# Patient Record
Sex: Male | Born: 1980 | Race: White | Hispanic: No | Marital: Married | Smoking: Current every day smoker
Health system: Southern US, Community
[De-identification: ages and names within clinical notes are randomized; demographics above are authoritative.]

## PROBLEM LIST (undated history)

## (undated) DIAGNOSIS — F32A Depression, unspecified: Secondary | ICD-10-CM

## (undated) DIAGNOSIS — E119 Type 2 diabetes mellitus without complications: Secondary | ICD-10-CM

## (undated) DIAGNOSIS — E785 Hyperlipidemia, unspecified: Secondary | ICD-10-CM

## (undated) DIAGNOSIS — M1990 Unspecified osteoarthritis, unspecified site: Secondary | ICD-10-CM

## (undated) DIAGNOSIS — J45909 Unspecified asthma, uncomplicated: Secondary | ICD-10-CM

## (undated) DIAGNOSIS — G4730 Sleep apnea, unspecified: Secondary | ICD-10-CM

## (undated) DIAGNOSIS — F419 Anxiety disorder, unspecified: Secondary | ICD-10-CM

## (undated) DIAGNOSIS — J449 Chronic obstructive pulmonary disease, unspecified: Secondary | ICD-10-CM

## (undated) DIAGNOSIS — G709 Myoneural disorder, unspecified: Secondary | ICD-10-CM

## (undated) HISTORY — DX: Sleep apnea, unspecified: G47.30

## (undated) HISTORY — DX: Depression, unspecified: F32.A

## (undated) HISTORY — DX: Anxiety disorder, unspecified: F41.9

## (undated) HISTORY — DX: Unspecified osteoarthritis, unspecified site: M19.90

## (undated) HISTORY — DX: Hyperlipidemia, unspecified: E78.5

## (undated) HISTORY — DX: Myoneural disorder, unspecified: G70.9

## (undated) NOTE — *Deleted (*Deleted)
[facility] - Clinic Note  08/20/2020     CHIEF COMPLAINT Patient presents for No chief complaint on file.   HISTORY OF PRESENT ILLNESS: Isaac Weiss is a 46 y.o. male who presents to the clinic today for:   pt is here on the referral of Dr. Hanley Seamen for concern of NPDR OU and ARMD OS, he states he went to see him for routine eye exam due to blurry and double vision, pt states he did not get a new glasses rx, pt endorses being diabetic, he states at his last appt with Hanley Seamen he "thinks" he had an eye infection bc his eyes were leaking "green and yellow" fluid, pt states his blood sugar fluctuates from 97-300, which is an improvement from 500, last A1c was 13.6 in January 2021, pt thinks he may have seen his PCP in May  Referring physician: Lavonda Jumbo, DO [address] [place],  [place] [address]  HISTORICAL INFORMATION:   Selected notes from the MEDICAL RECORD NUMBER Referred by Dr. 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 (Ophthalmic Drugs)   No current facility-administered medications for this visit. (Ophthalmic Drugs)   Current Outpatient Medications (Other)  Medication Sig  . gabapentin (NEURONTIN) 300 MG capsule TAKE 1 CAPSULE BY MOUTH THREE TIMES DAILY  . Accu-Chek FastClix Lancets MISC Check blood sugar 4 times daily before meal and at bedtime  . albuterol (VENTOLIN HFA) 108 (90 Base) MCG/ACT inhaler Inhale 2 puffs into the lungs every 6 hours as needed for wheezing or shortness of breath.  Marland Kitchen atomoxetine (STRATTERA) 40 MG capsule Take 40 mg by mouth every morning.  . Blood Glucose Monitoring Suppl (ACCU-CHEK COMPACT CARE KIT) KIT 1 kit by Does not apply route 4 (four) times daily -  before meals and at bedtime.  . fluticasone (FLOVENT HFA) 110 MCG/ACT inhaler Inhale 1-2 puffs into the  lungs daily.  Marland Kitchen glucose blood (ACCU-CHEK GUIDE) test strip Use as instructed  . hydrOXYzine (VISTARIL) 25 MG capsule Take 25 mg by mouth 3 (three) times daily as needed.  . insulin glargine (LANTUS) 100 UNIT/ML injection Inject 0.3 mLs (30 Units total) into the skin daily.  . insulin NPH-regular Human (HUMULIN 70/30) (70-30) 100 UNIT/ML injection Inject 30 Units into the skin 3 (three) times daily before meals.  . Insulin Pen Needle 32G X 4 MM MISC Check blood sugar TID and before meals and at bed time. Administer insulin with needle.  . Insulin Syringe 27G X 1/2" 1 ML MISC Inject 10 Units into the skin 3 (three) times daily before meals.  . lamoTRIgine (LAMICTAL) 100 MG tablet Take 100 mg by mouth 2 (two) times daily.  Marland Kitchen lithium carbonate (ESKALITH) 450 MG CR tablet Take 900 mg by mouth daily.  . metFORMIN (GLUCOPHAGE-XR) 500 MG 24 hr tablet TAKE 2 TABLETS BY MOUTH 2 TIMES DAILY  . prazosin (MINIPRESS) 1 MG capsule Take 2 mg by mouth at bedtime.  . propranolol (INDERAL) 20 MG tablet Take 20 mg by mouth 3 (three) times daily.   No current facility-administered medications for this visit. (Other)      REVIEW OF SYSTEMS:    ALLERGIES No Known Allergies  PAST MEDICAL HISTORY Past Medical History:  Diagnosis Date  . Anxiety    Phreesia 07/27/2020  . Arthritis    Phreesia  07/27/2020  . Asthma   . COPD (chronic obstructive pulmonary disease) (HCC)   . Depression    Phreesia 07/27/2020  . Diabetes mellitus without complication (HCC)   . Hyperlipidemia    Phreesia 07/27/2020  . Neuromuscular disorder (HCC)    Phreesia 07/27/2020  . Sleep apnea    Phreesia 07/27/2020   No past surgical history on file.  FAMILY HISTORY No family history on file.  SOCIAL HISTORY Social History   Tobacco Use  . Smoking status: Current Every Day Smoker    Packs/day: 1.00    Types: Cigarettes  . Smokeless tobacco: Never Used  Substance Use Topics  . Alcohol use: Yes  . Drug use: Never          OPHTHALMIC EXAM:  Not recorded     IMAGING AND PROCEDURES  Imaging and Procedures for 08/20/2020           ASSESSMENT/PLAN:    ICD-10-CM   1. Mild nonproliferative diabetic retinopathy of both eyes without macular edema associated with type 2 diabetes mellitus (HCC)  Z61.0960   2. Retinal edema  H35.81   3. Hypertension, unspecified type  I10   4. Hypertensive retinopathy of both eyes  H35.033     1,2. Mild nonproliferative diabetic retinopathy w/o DME, OU - A1c 13.6 in Jan 2021 - The incidence, risk factors for progression, natural history and treatment options for diabetic retinopathy were discussed with patient.   - The need for close monitoring of blood glucose, blood pressure, and serum lipids, avoiding cigarette or any type of tobacco, and the need for long term follow up was also discussed with patient. - exam shows scattered MA OU - OCT without diabetic macular edema, OU -- tr cystic changes and IRHM OS - f/u in 3 mos -- DFE/OCT/FA (optos, transit OS)    Ophthalmic Meds Ordered this visit:  No orders of the defined types were placed in this encounter.      No follow-ups on file.  There are no Patient Instructions on file for this visit.   Explained the diagnoses, plan, and follow up with the patient and they expressed understanding.  Patient expressed understanding of the importance of proper follow up care.  This document serves as a record of services personally performed by Karie Chimera, MD, PhD. It was created on their behalf by Annalee Genta, COMT. The creation of this record is the provider's dictation and/or activities during the visit.  Electronically signed by: Annalee Genta, COMT 08/19/20 10:44 AM    Karie Chimera, M.D., Ph.D. Diseases & Surgery of the Retina and Vitreous [facility]    Abbreviations: M myopia (nearsighted); A astigmatism; H hyperopia (farsighted); P presbyopia; Mrx spectacle  prescription;  CTL contact lenses; OD right eye; OS left eye; OU both eyes  XT exotropia; ET esotropia; PEK punctate epithelial keratitis; PEE punctate epithelial erosions; DES dry eye syndrome; MGD meibomian gland dysfunction; ATs artificial tears; PFAT's preservative free artificial tears; NSC nuclear sclerotic cataract; PSC posterior subcapsular cataract; ERM epi-retinal membrane; PVD posterior vitreous detachment; RD retinal detachment; DM diabetes mellitus; DR diabetic retinopathy; NPDR non-proliferative diabetic retinopathy; PDR proliferative diabetic retinopathy; CSME clinically significant macular edema; DME diabetic macular edema; dbh dot blot hemorrhages; CWS cotton wool spot; POAG primary open angle glaucoma; C/D cup-to-disc ratio; HVF humphrey visual field; GVF goldmann visual field; OCT optical coherence tomography; IOP intraocular pressure; BRVO Branch retinal vein occlusion; CRVO central retinal vein occlusion; CRAO central retinal artery occlusion;  BRAO branch retinal artery occlusion; RT retinal tear; SB scleral buckle; PPV pars plana vitrectomy; VH Vitreous hemorrhage; PRP panretinal laser photocoagulation; IVK intravitreal kenalog; VMT vitreomacular traction; MH Macular hole;  NVD neovascularization of the disc; NVE neovascularization elsewhere; AREDS age related eye disease study; ARMD age related macular degeneration; POAG primary open angle glaucoma; EBMD epithelial/anterior basement membrane dystrophy; ACIOL anterior chamber intraocular lens; IOL intraocular lens; PCIOL posterior chamber intraocular lens; Phaco/IOL phacoemulsification with intraocular lens placement; [place] photorefractive keratectomy; LASIK laser assisted in situ keratomileusis; HTN hypertension; DM diabetes mellitus; COPD chronic obstructive pulmonary disease

---

## 2006-12-16 DIAGNOSIS — F259 Schizoaffective disorder, unspecified: Secondary | ICD-10-CM | POA: Insufficient documentation

## 2019-09-02 ENCOUNTER — Encounter (HOSPITAL_COMMUNITY): Payer: Self-pay

## 2019-09-02 ENCOUNTER — Emergency Department (HOSPITAL_COMMUNITY)
Admission: EM | Admit: 2019-09-02 | Discharge: 2019-09-03 | Disposition: A | Discharge status: Home / Self Care | Payer: Medicaid Other | Attending: Emergency Medicine | Admitting: Emergency Medicine

## 2019-09-02 ENCOUNTER — Other Ambulatory Visit: Payer: Self-pay

## 2019-09-02 DIAGNOSIS — E1165 Type 2 diabetes mellitus with hyperglycemia: Secondary | ICD-10-CM | POA: Diagnosis not present

## 2019-09-02 DIAGNOSIS — Z5321 Procedure and treatment not carried out due to patient leaving prior to being seen by health care provider: Secondary | ICD-10-CM | POA: Diagnosis not present

## 2019-09-02 DIAGNOSIS — R519 Headache, unspecified: Secondary | ICD-10-CM | POA: Diagnosis present

## 2019-09-02 HISTORY — DX: Unspecified asthma, uncomplicated: J45.909

## 2019-09-02 HISTORY — DX: Chronic obstructive pulmonary disease, unspecified: J44.9

## 2019-09-02 HISTORY — DX: Type 2 diabetes mellitus without complications: E11.9

## 2019-09-02 LAB — URINALYSIS, ROUTINE W REFLEX MICROSCOPIC
Bacteria, UA: NONE SEEN
Bilirubin Urine: NEGATIVE
Glucose, UA: >=500 mg/dL — AB
Hgb urine dipstick: NEGATIVE
Ketones, ur: NEGATIVE mg/dL
Leukocytes,Ua: NEGATIVE
Nitrite: NEGATIVE
Protein, ur: NEGATIVE mg/dL
Specific Gravity, Urine: 1.026 (ref 1.005–1.030)
pH: 7 (ref 5.0–8.0)

## 2019-09-02 LAB — CBG MONITORING, ED: Glucose-Capillary: 520 mg/dL (ref 70–99)

## 2019-09-02 LAB — CBC
HCT: 43.5 % (ref 39.0–52.0)
Hemoglobin: 15 g/dL (ref 13.0–17.0)
MCH: 33.2 pg (ref 26.0–34.0)
MCHC: 34.5 g/dL (ref 30.0–36.0)
MCV: 96.2 fL (ref 80.0–100.0)
Platelets: 263 10*3/uL (ref 150–400)
RBC: 4.52 MIL/uL (ref 4.22–5.81)
RDW: 12.1 % (ref 11.5–15.5)
WBC: 8.8 10*3/uL (ref 4.0–10.5)
nRBC: 0 % (ref 0.0–0.2)

## 2019-09-02 LAB — BASIC METABOLIC PANEL
Anion gap: 12 (ref 5–15)
BUN: 10 mg/dL (ref 6–20)
CO2: 24 mmol/L (ref 22–32)
Calcium: 9 mg/dL (ref 8.9–10.3)
Chloride: 97 mmol/L — ABNORMAL LOW (ref 98–111)
Creatinine, Ser: 0.9 mg/dL (ref 0.61–1.24)
GFR calc Af Amer: >60 mL/min (ref >60)
GFR calc non Af Amer: >60 mL/min (ref >60)
Glucose, Bld: 526 mg/dL (ref 70–99)
Potassium: 3.9 mmol/L (ref 3.5–5.1)
Sodium: 133 mmol/L — ABNORMAL LOW (ref 135–145)

## 2019-09-02 NOTE — ED Notes (Signed)
Pt stated he didn't want to wait any longer and returned his pt labels to the registration desk.

## 2019-09-02 NOTE — ED Triage Notes (Signed)
Pt arrived stating he has been shaking, having headaches and dry mouth for the last 3-4 days. Reports taking his blood sugar at 8pm reading 569. Reports being on insulin and was in jail where they stopped it, states he has not had any since January.

## 2019-09-02 NOTE — ED Notes (Signed)
CRITICAL VALUE STICKER  CRITICAL VALUE: Glu 526  RECEIVER (on-site recipient of call): Jake T RN  [place] NOTIFIED: 09/02/2019  MESSENGER (representative from lab): Blanch Media  RESPONSE: Acuity adjusted. Triage will bring patient back ASAP for provider to see.

## 2019-09-07 ENCOUNTER — Emergency Department (HOSPITAL_COMMUNITY)
Admission: EM | Admit: 2019-09-07 | Discharge: 2019-09-08 | Disposition: A | Discharge status: Home / Self Care | Payer: Medicaid Other | Attending: Emergency Medicine | Admitting: Emergency Medicine

## 2019-09-07 ENCOUNTER — Other Ambulatory Visit: Payer: Self-pay

## 2019-09-07 DIAGNOSIS — E1165 Type 2 diabetes mellitus with hyperglycemia: Secondary | ICD-10-CM | POA: Diagnosis present

## 2019-09-07 DIAGNOSIS — Z5321 Procedure and treatment not carried out due to patient leaving prior to being seen by health care provider: Secondary | ICD-10-CM | POA: Insufficient documentation

## 2019-09-07 LAB — CBC
HCT: 46.1 % (ref 39.0–52.0)
Hemoglobin: 16.1 g/dL (ref 13.0–17.0)
MCH: 33.5 pg (ref 26.0–34.0)
MCHC: 34.9 g/dL (ref 30.0–36.0)
MCV: 95.8 fL (ref 80.0–100.0)
Platelets: 279 10*3/uL (ref 150–400)
RBC: 4.81 MIL/uL (ref 4.22–5.81)
RDW: 12.1 % (ref 11.5–15.5)
WBC: 7.9 10*3/uL (ref 4.0–10.5)
nRBC: 0 % (ref 0.0–0.2)

## 2019-09-07 LAB — BASIC METABOLIC PANEL
Anion gap: 12 (ref 5–15)
BUN: 10 mg/dL (ref 6–20)
CO2: 23 mmol/L (ref 22–32)
Calcium: 9 mg/dL (ref 8.9–10.3)
Chloride: 95 mmol/L — ABNORMAL LOW (ref 98–111)
Creatinine, Ser: 1.16 mg/dL (ref 0.61–1.24)
GFR calc Af Amer: >60 mL/min (ref >60)
GFR calc non Af Amer: >60 mL/min (ref >60)
Glucose, Bld: 514 mg/dL (ref 70–99)
Potassium: 4.2 mmol/L (ref 3.5–5.1)
Sodium: 130 mmol/L — ABNORMAL LOW (ref 135–145)

## 2019-09-07 LAB — CBG MONITORING, ED: Glucose-Capillary: 574 mg/dL (ref 70–99)

## 2019-09-07 NOTE — ED Triage Notes (Signed)
Patient states that his blood sugar has been high for the last couple of days. States he is not on insulin and his dr. Marland Kitchenwon't put me on it". NAD noted.

## 2019-09-08 NOTE — ED Notes (Signed)
Pt called 2nd time no answer 

## 2019-09-08 NOTE — ED Notes (Signed)
Pt called for room no answer

## 2019-09-12 ENCOUNTER — Ambulatory Visit (INDEPENDENT_AMBULATORY_CARE_PROVIDER_SITE_OTHER): Payer: Medicaid Other | Admitting: Family Medicine

## 2019-09-12 ENCOUNTER — Other Ambulatory Visit: Payer: Self-pay

## 2019-09-12 ENCOUNTER — Encounter: Payer: Self-pay | Admitting: Family Medicine

## 2019-09-12 DIAGNOSIS — Z23 Encounter for immunization: Secondary | ICD-10-CM

## 2019-09-12 DIAGNOSIS — J45909 Unspecified asthma, uncomplicated: Secondary | ICD-10-CM | POA: Insufficient documentation

## 2019-09-12 DIAGNOSIS — G4730 Sleep apnea, unspecified: Secondary | ICD-10-CM | POA: Insufficient documentation

## 2019-09-12 DIAGNOSIS — J449 Chronic obstructive pulmonary disease, unspecified: Secondary | ICD-10-CM | POA: Insufficient documentation

## 2019-09-12 DIAGNOSIS — E1169 Type 2 diabetes mellitus with other specified complication: Secondary | ICD-10-CM | POA: Insufficient documentation

## 2019-09-12 DIAGNOSIS — E119 Type 2 diabetes mellitus without complications: Secondary | ICD-10-CM | POA: Insufficient documentation

## 2019-09-12 LAB — POCT GLYCOSYLATED HEMOGLOBIN (HGB A1C): HbA1c, POC (controlled diabetic range): 11.3 % — AB (ref 0.0–7.0)

## 2019-09-12 MED ORDER — METFORMIN HCL ER 500 MG PO TB24: 1000.0000 mg | ORAL_TABLET | Freq: Two times a day (BID) | ORAL | 3 refills | Status: DC

## 2019-09-12 MED ORDER — METFORMIN HCL ER 500 MG PO TB24: 1000.0000 mg | ORAL_TABLET | Freq: Every day | ORAL | 3 refills | Status: DC

## 2019-09-12 MED ORDER — INSULIN GLARGINE 100 UNIT/ML SOLOSTAR PEN: 20.0000 [IU] | PEN_INJECTOR | SUBCUTANEOUS | 11 refills | Status: DC

## 2019-09-12 MED ORDER — ACCU-CHEK AVIVA PLUS VI STRP: ORAL_STRIP | 12 refills | Status: DC

## 2019-09-12 MED ORDER — ACCU-CHEK COMPACT PLUS CARE KIT: 1.0000 | PACK | Freq: Three times a day (TID) | 0 refills | Status: DC

## 2019-09-12 MED ORDER — ACCU-CHEK MULTICLIX LANCETS MISC: 12 refills | Status: DC

## 2019-09-12 NOTE — Progress Notes (Signed)
   Subjective:    Patient ID: Isaac Weiss, male    DOB: September 22, 1981, 38 y.o.   MRN: [id number]   CC: Establish care  HPI:  Uncontrolled T2DM Isaac Weiss is presenting today to establish care.  He was recently released from prison and while in prison they stopped his insulin in December of last year or January of this year. Was taking metformin and insulin and felt great losing weight. He was taking Novolin 70/30 15u in am and 20u at night before dinner.  He says his A1c has steadily been increasing from 5.4 to 7.5 since being off of insulin. Blood sugars in the 500s and was told to check twice a day but checks 4-5x/ day now. This morning at 3 am CBG was 202, later this morning 258 was snacking on candy corn. Tries not to eat because his sugars are so high but he did not have breakfast.  Recently was seen in the ED due to shaking and dizziness and disoriented.  Occasionally cant remember words, has headaches, dry mouth, and thick saliva. Weakness sometimes, with polydipsia, polyuria, but without polyphagia. Mouth sores cracked red painful about 4 months.   Smoking status reviewed.  Current smoker cessation discussed in reference to uncontrolled diabetes is down to 1 pack a day.  Review of Systems Per HPI, also denies recent illness, fever, changes in vision, chest pain, shortness of breath, abdominal pain, N/V/D   Patient Active Problem List   Diagnosis Date Noted  . Sleep apnea 09/12/2019  . Diabetes mellitus without complication ([place])   . Asthma   . COPD (chronic obstructive pulmonary disease) (HCC)      Objective:  BP 128/78   Pulse 78   Ht 5\' 8"  (1.727 m)   Wt 278 lb (126.1 kg)   SpO2 97%   BMI 42.27 kg/m  Vitals and nursing note reviewed  General: Appears well, no acute distress. Age appropriate. Cardiac: RRR, normal heart sounds, no murmurs Respiratory: CTAB, normal effort Abdomen: soft, nontender, nondistended Extremities: No edema or cyanosis. Skin: Warm and dry, no  rashes noted. New tattoo with erythema. Neuro: alert and oriented, no focal deficits Psych: normal affect Diabetic Foot Exam - Simple   Simple Foot Form Diabetic Foot exam was performed with the following findings: Yes 09/12/2019 11:21 AM  Visual Inspection Sensation Testing Pulse Check Comments     Assessment & Plan:    Diabetes mellitus without complication (HCC) Uncontrolled.  A1c today is 11.3.  We will start Lantus 20 units in the morning daily.  Continue Metformin increasing dose to 1000 mg twice daily.  Check blood sugars 3 times daily before meals and at bedtime.  Keep a log and bring to next office visit.  Follow-up in 1 month or sooner if needed for repeat A1c and insulin adjustment.  Return precautions and reasons to present to the ED discussed.    Gerlene Fee, [facility] PGY-1

## 2019-09-12 NOTE — Patient Instructions (Signed)
It was very nice to meet you today. Please enjoy the rest of your week. Today you were seen to establish care.  Your A1c was 11.3 we changed medications stop pioglitazone, stop glipizide, stop current Metformin regimen. I have prescribed Lantus 20 units daily, Metformin 1000 units twice daily. Follow up in 1 month for a repeat A1c and to address other concerns or sooner if needed.   Please call the clinic at [phone] if your symptoms worsen or you have any concerns. It was our pleasure to serve you.

## 2019-09-14 ENCOUNTER — Telehealth: Payer: Self-pay | Admitting: *Deleted

## 2019-09-14 NOTE — Telephone Encounter (Signed)
Rx request for pen needles. Isaac Weiss, CMA

## 2019-09-17 ENCOUNTER — Encounter: Payer: Self-pay | Admitting: Family Medicine

## 2019-09-17 MED ORDER — INSULIN PEN NEEDLE 32G X 4 MM MISC: 0 refills | Status: DC

## 2019-09-17 NOTE — Telephone Encounter (Signed)
Pt informed.  Herron Fero, CMA  

## 2019-09-18 ENCOUNTER — Encounter: Payer: Self-pay | Admitting: Family Medicine

## 2019-09-18 ENCOUNTER — Telehealth: Payer: Self-pay | Admitting: *Deleted

## 2019-09-18 NOTE — Telephone Encounter (Signed)
Pt informed. Deseree Blount, CMA  

## 2019-09-18 NOTE — Assessment & Plan Note (Signed)
Uncontrolled.  A1c today is 11.3.  We will start Lantus 20 units in the morning daily.  Continue Metformin increasing dose to 1000 mg twice daily.  Check blood sugars 3 times daily before meals and at bedtime.  Keep a log and bring to next office visit.  Follow-up in 1 month or sooner if needed for repeat A1c and insulin adjustment.  Return precautions and reasons to present to the ED discussed.

## 2019-11-10 ENCOUNTER — Other Ambulatory Visit: Payer: Self-pay | Admitting: Family Medicine

## 2019-11-10 DIAGNOSIS — E119 Type 2 diabetes mellitus without complications: Secondary | ICD-10-CM

## 2019-11-20 ENCOUNTER — Ambulatory Visit (INDEPENDENT_AMBULATORY_CARE_PROVIDER_SITE_OTHER): Payer: Medicaid Other | Admitting: Family Medicine

## 2019-11-20 ENCOUNTER — Other Ambulatory Visit: Payer: Self-pay

## 2019-11-20 VITALS — BP 110/72 | HR 83 | Wt 274.6 lb

## 2019-11-20 DIAGNOSIS — Z0000 Encounter for general adult medical examination without abnormal findings: Secondary | ICD-10-CM | POA: Diagnosis not present

## 2019-11-20 DIAGNOSIS — M25512 Pain in left shoulder: Secondary | ICD-10-CM

## 2019-11-20 DIAGNOSIS — E119 Type 2 diabetes mellitus without complications: Secondary | ICD-10-CM

## 2019-11-20 DIAGNOSIS — J449 Chronic obstructive pulmonary disease, unspecified: Secondary | ICD-10-CM

## 2019-11-20 DIAGNOSIS — M5410 Radiculopathy, site unspecified: Secondary | ICD-10-CM

## 2019-11-20 DIAGNOSIS — G8929 Other chronic pain: Secondary | ICD-10-CM

## 2019-11-20 LAB — POCT GLYCOSYLATED HEMOGLOBIN (HGB A1C): HbA1c, POC (controlled diabetic range): 13.6 % — AB (ref 0.0–7.0)

## 2019-11-20 MED ORDER — FLOVENT HFA 110 MCG/ACT IN AERO: 1.0000 | INHALATION_SPRAY | Freq: Every day | RESPIRATORY_TRACT | 12 refills | Status: DC

## 2019-11-20 MED ORDER — ACCU-CHEK FASTCLIX LANCETS MISC: 12 refills | Status: DC

## 2019-11-20 MED ORDER — ALBUTEROL SULFATE HFA 108 (90 BASE) MCG/ACT IN AERS: 2.0000 | INHALATION_SPRAY | Freq: Four times a day (QID) | RESPIRATORY_TRACT | 0 refills | Status: DC | PRN

## 2019-11-20 MED ORDER — INSULIN PEN NEEDLE 32G X 4 MM MISC: 3 refills | Status: DC

## 2019-11-20 MED ORDER — GABAPENTIN 300 MG PO CAPS: 300.0000 mg | ORAL_CAPSULE | Freq: Three times a day (TID) | ORAL | 3 refills | Status: DC

## 2019-11-20 MED ORDER — ACCU-CHEK GUIDE VI STRP: ORAL_STRIP | 12 refills | Status: DC

## 2019-11-20 MED ORDER — NOVOLIN 70/30 (70-30) 100 UNIT/ML ~~LOC~~ SUSP: 10.0000 [IU] | Freq: Three times a day (TID) | SUBCUTANEOUS | 11 refills | Status: DC

## 2019-11-20 NOTE — Patient Instructions (Addendum)
It was very nice to see you today. Please enjoy the rest of your week. Today you were seen for a diabetes follow up. We changed you insulin regimen to Novolin 70/30 you will inject 10U three times daily with meals. I have prescribed gabapentin for your shoulder pain. You will receive your results via phone or my chart. Follow up in 3 months for diabetes check up or sooner if needed. You will be contacted by a chronic care management team member for help with increasing insulin.  Please call the clinic at [phone] if your symptoms worsen or you have any concerns. It was our pleasure to serve you.  Diabetes Mellitus and Nutrition, Adult When you have diabetes (diabetes mellitus), it is very important to have healthy eating habits because your blood sugar (glucose) levels are greatly affected by what you eat and drink. Eating healthy foods in the appropriate amounts, at about the same times every day, can help you:  Control your blood glucose.  Lower your risk of heart disease.  Improve your blood pressure.  Reach or maintain a healthy weight. Every person with diabetes is different, and each person has different needs for a meal plan. Your health care provider may recommend that you work with a diet and nutrition specialist (dietitian) to make a meal plan that is best for you. Your meal plan may vary depending on factors such as:  The calories you need.  The medicines you take.  Your weight.  Your blood glucose, blood pressure, and cholesterol levels.  Your activity level.  Other health conditions you have, such as heart or kidney disease. How do carbohydrates affect me? Carbohydrates, also called carbs, affect your blood glucose level more than any other type of food. Eating carbs naturally raises the amount of glucose in your blood. Carb counting is a method for keeping track of how many carbs you eat. Counting carbs is important to keep your blood glucose at a healthy level,  especially if you use insulin or take certain oral diabetes medicines. It is important to know how many carbs you can safely have in each meal. This is different for every person. Your dietitian can help you calculate how many carbs you should have at each meal and for each snack. Foods that contain carbs include:  Bread, cereal, rice, pasta, and crackers.  Potatoes and corn.  Peas, beans, and lentils.  Milk and yogurt.  Fruit and juice.  Desserts, such as cakes, cookies, ice cream, and candy. How does alcohol affect me? Alcohol can cause a sudden decrease in blood glucose (hypoglycemia), especially if you use insulin or take certain oral diabetes medicines. Hypoglycemia can be a life-threatening condition. Symptoms of hypoglycemia (sleepiness, dizziness, and confusion) are similar to symptoms of having too much alcohol. If your health care provider says that alcohol is safe for you, follow these guidelines:  Limit alcohol intake to no more than 1 drink per day for nonpregnant women and 2 drinks per day for men. One drink equals 12 oz of beer, 5 oz of wine, or 1 oz of hard liquor.  Do not drink on an empty stomach.  Keep yourself hydrated with water, diet soda, or unsweetened iced tea.  Keep in mind that regular soda, juice, and other mixers may contain a lot of sugar and must be counted as carbs. What are tips for following this plan?  Reading food labels  Start by checking the serving size on the "Nutrition Facts" label of packaged foods and  drinks. The amount of calories, carbs, fats, and other nutrients listed on the label is based on one serving of the item. Many items contain more than one serving per package.  Check the total grams (g) of carbs in one serving. You can calculate the number of servings of carbs in one serving by dividing the total carbs by 15. For example, if a food has 30 g of total carbs, it would be equal to 2 servings of carbs.  Check the number of grams  (g) of saturated and trans fats in one serving. Choose foods that have low or no amount of these fats.  Check the number of milligrams (mg) of salt (sodium) in one serving. Most people should limit total sodium intake to less than 2,300 mg per day.  Always check the nutrition information of foods labeled as "low-fat" or "nonfat". These foods may be higher in added sugar or refined carbs and should be avoided.  Talk to your dietitian to identify your daily goals for nutrients listed on the label. Shopping  Avoid buying canned, premade, or processed foods. These foods tend to be high in fat, sodium, and added sugar.  Shop around the outside edge of the grocery store. This includes fresh fruits and vegetables, bulk grains, fresh meats, and fresh dairy. Cooking  Use low-heat cooking methods, such as baking, instead of high-heat cooking methods like deep frying.  Cook using healthy oils, such as olive, canola, or sunflower oil.  Avoid cooking with butter, cream, or high-fat meats. Meal planning  Eat meals and snacks regularly, preferably at the same times every day. Avoid going long periods of time without eating.  Eat foods high in fiber, such as fresh fruits, vegetables, beans, and whole grains. Talk to your dietitian about how many servings of carbs you can eat at each meal.  Eat 4-6 ounces (oz) of lean protein each day, such as lean meat, chicken, fish, eggs, or tofu. One oz of lean protein is equal to: ? 1 oz of meat, chicken, or fish. ? 1 egg. ?  cup of tofu.  Eat some foods each day that contain healthy fats, such as avocado, nuts, seeds, and fish. Lifestyle  Check your blood glucose regularly.  Exercise regularly as told by your health care provider. This may include: ? 150 minutes of moderate-intensity or vigorous-intensity exercise each week. This could be brisk walking, biking, or water aerobics. ? Stretching and doing strength exercises, such as yoga or weightlifting, at  least 2 times a week.  Take medicines as told by your health care provider.  Do not use any products that contain nicotine or tobacco, such as cigarettes and e-cigarettes. If you need help quitting, ask your health care provider.  Work with a Social worker or diabetes educator to identify strategies to manage stress and any emotional and social challenges. Questions to ask a health care provider  Do I need to meet with a diabetes educator?  Do I need to meet with a dietitian?  What number can I call if I have questions?  When are the best times to check my blood glucose? Where to find more information:  American Diabetes Association: diabetes.org  Academy of Nutrition and Dietetics: www.eatright.[employer] of Diabetes and Digestive and Kidney Diseases (NIH): [employer] Summary  A healthy meal plan will help you control your blood glucose and maintain a healthy lifestyle.  Working with a diet and nutrition specialist (dietitian) can help you make a meal plan that is  best for you.  Keep in mind that carbohydrates (carbs) and alcohol have immediate effects on your blood glucose levels. It is important to count carbs and to use alcohol carefully. This information is not intended to replace advice given to you by your health care provider. Make sure you discuss any questions you have with your health care provider. Document Revised: 09/30/2017 Document Reviewed: 11/22/2016 Elsevier Patient Education  2020 [employer].

## 2019-11-20 NOTE — Assessment & Plan Note (Addendum)
Not well controlled. Today A1c is 13.6. Microalbumin/creatinine ratio is 9. Lipid panel tot chol 165, LDL 76, HDL 28, triglycerides 379. At our initial visit we started at 20 U, patient increased himself to 40U and ran out of insulin and did not notify the office. Today we will stop lantus and start humulin 10 units before meals. (Novolin was not covered by patients insurance). -Continue Metformin -Discontinue Lantus -Start Humulin 10U TID before meals -Referral to CCM, will appreciate recommendations in the interim  -Return in 3 months will log for repeat A1c, discuss lipid panel further to improve HDL and decrease triglycerides

## 2019-11-20 NOTE — Progress Notes (Signed)
  Patient Name: Isaac Weiss Date of Birth: [birth year] Date of Visit: 11/20/19 PCP: Lavonda Jumbo, DO  Chief Complaint: Diabetes check up  Subjective: Isaac Weiss is a pleasant 39 y.o. with medical history significant for insulin dependent type II diabetes and asthma/COPD presenting today for a diabetes check up.   T2DM Since our last visit he has been out of insulin because he increased his Lantus from 20 to 40 units. He says he has a history of being on 100 units of lantus before so he wanted to increase it on his own because he knew 20 units was not enough. He continues to have fatigue, polyphagia, polyuria, blurry vision, as well as hand and feet paresthesias. He checks his blood sugar 4 times daily before meal and before bed. His average blood sugar is self reported as 400 with the lowest being 295. In the past has done better with Novolin/Humulin.   Lt. Shoulder pain He is experiencing diffuse left shoulder pain although more pronounced anteriorly. It is sharp in nature and is chronic but feels like it is worsening due to newly interrupting his sleep. He has used heating pads and tiger balm with minimal relief. He has some tingling some times in the first 3 fingers and sometimes in the last 2 fingers. He does not endorse numbness along the shoulder or forearm muscles.   Asthma/COPD Occasionally has shortness of breath but not outside of his normal. He does not have any medication and needs a refill. He continues to smoke and understands this will improve his overall health as well as aiding with less shortness of breath. He does not have issue with breathing currently and does not have chest pain with these episodes. His last episode of increase work of breathing was during a sexual encounter with his wife.   ROS: Per HPI.   I have reviewed the patient's medical, surgical, family, and social history as appropriate.   Objective: Vitals:   11/20/19 1521  BP: 110/72  Pulse:  83  SpO2: 98%     Assessment & Plan:  Diabetes mellitus without complication (HCC) Not well controlled. Today A1c is 13.6. Microalbumin/creatinine ratio is 9. Lipid panel tot chol 165, LDL 76, HDL 28, triglycerides 379. At our initial visit we started at 20 U, patient increased himself to 40U and ran out of insulin and did not notify the office. Today we will stop lantus and start humulin 10 units before meals. (Novolin was not covered by patients insurance). -Continue Metformin -Discontinue Lantus -Start Humulin 10U TID before meals -Referral to CCM, will appreciate recommendations in the interim  -Return in 3 months will log for repeat A1c, discuss lipid panel further to improve HDL and decrease triglycerides  COPD (chronic obstructive pulmonary disease) (HCC) -Start flovent daily and albuterol PRN -Consider smoking cessation, continue to counsel at subsequent visits -Go to ED if symptoms worsen or fail to improve.   Shoulder pain, left Chronic. 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 Difficult to distinguish between paresthesia due to his diabetes verses an impinged extremity nerve. Will trial gabapentin for symptomatic relief. -Continue home heating pad and muscle rub -Start gabapentin 300mg  TID PRN -Return if symptoms worsen or fail to improve.    , DO [facility] PGY-1  *Patient discussed with [facility]. [facility]

## 2019-11-21 ENCOUNTER — Other Ambulatory Visit: Payer: Self-pay | Admitting: Family Medicine

## 2019-11-21 ENCOUNTER — Encounter: Payer: Self-pay | Admitting: Family Medicine

## 2019-11-21 DIAGNOSIS — E119 Type 2 diabetes mellitus without complications: Secondary | ICD-10-CM

## 2019-11-21 LAB — LIPID PANEL
Chol/HDL Ratio: 5.9 ratio — ABNORMAL HIGH (ref 0.0–5.0)
Cholesterol, Total: 165 mg/dL (ref 100–199)
HDL: 28 mg/dL — ABNORMAL LOW (ref >39)
LDL Chol Calc (NIH): 76 mg/dL (ref 0–99)
Triglycerides: 379 mg/dL — ABNORMAL HIGH (ref 0–149)
VLDL Cholesterol Cal: 61 mg/dL — ABNORMAL HIGH (ref 5–40)

## 2019-11-21 MED ORDER — "INSULIN SYRINGE 27G X 1/2"" 1 ML MISC": 10.0000 [IU] | Freq: Three times a day (TID) | 3 refills | Status: AC

## 2019-11-21 MED ORDER — HUMULIN 70/30 (70-30) 100 UNIT/ML ~~LOC~~ SUSP: 10.0000 [IU] | Freq: Three times a day (TID) | SUBCUTANEOUS | 11 refills | Status: DC

## 2019-11-22 ENCOUNTER — Ambulatory Visit: Payer: Self-pay | Admitting: Pharmacist

## 2019-11-22 ENCOUNTER — Encounter: Payer: Self-pay | Admitting: Family Medicine

## 2019-11-22 DIAGNOSIS — E119 Type 2 diabetes mellitus without complications: Secondary | ICD-10-CM

## 2019-11-22 LAB — MICROALBUMIN / CREATININE URINE RATIO
Creatinine, Urine: 46.6 mg/dL
Microalb/Creat Ratio: 9 mg/g creat (ref 0–29)
Microalbumin, Urine: 4.3 ug/mL

## 2019-11-26 ENCOUNTER — Encounter: Payer: Self-pay | Admitting: Family Medicine

## 2019-11-26 DIAGNOSIS — M25512 Pain in left shoulder: Secondary | ICD-10-CM | POA: Insufficient documentation

## 2019-11-26 DIAGNOSIS — M25511 Pain in right shoulder: Secondary | ICD-10-CM | POA: Insufficient documentation

## 2019-11-26 DIAGNOSIS — F418 Other specified anxiety disorders: Secondary | ICD-10-CM | POA: Insufficient documentation

## 2019-11-26 NOTE — Assessment & Plan Note (Addendum)
-  Start flovent daily and albuterol PRN -Consider smoking cessation, continue to counsel at subsequent visits -Go to ED if symptoms worsen or fail to improve.

## 2019-11-26 NOTE — Assessment & Plan Note (Signed)
Chronic. 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 Difficult to distinguish between paresthesia due to his diabetes verses an impinged extremity nerve. Will trial gabapentin for symptomatic relief. -Continue home heating pad and muscle rub -Start gabapentin 300mg  TID PRN -Return if symptoms worsen or fail to improve.

## 2019-11-27 ENCOUNTER — Ambulatory Visit: Payer: Self-pay | Admitting: Pharmacist

## 2019-11-27 DIAGNOSIS — E119 Type 2 diabetes mellitus without complications: Secondary | ICD-10-CM

## 2019-11-27 NOTE — Progress Notes (Signed)
Chronic Care Management   Visit Note  11/27/2019 Name: Isaac Weiss MRN: [id number] DOB: 02-27-1981  Referred by: Gerlene Fee, DO Reason for referral : Chronic Care Management   Isaac Weiss is a 39 y.o. year old male who is a primary care patient of [place], Naaman Plummer, DO. The CCM team was consulted for assistance with chronic disease management and care coordination needs related to DMII  Review of patient status, including review of consultants reports, relevant laboratory and other test results, and collaboration with appropriate care team members and the patient's provider was performed as part of comprehensive patient evaluation and provision of chronic care management services.     Medications: Outpatient Encounter Medications as of 11/27/2019  Medication Sig  . gabapentin (NEURONTIN) 300 MG capsule Take 1 capsule (300 mg total) by mouth 3 (three) times daily.  . Accu-Chek FastClix Lancets MISC Check blood sugar 4 times daily before meal and at bedtime  . albuterol (VENTOLIN HFA) 108 (90 Base) MCG/ACT inhaler Inhale 2 puffs into the lungs every 6 (six) hours as needed for wheezing or shortness of breath.  . ARIPiprazole (ABILIFY) 10 MG tablet Take 10 mg by mouth at bedtime.  Marland Kitchen atomoxetine (STRATTERA) 40 MG capsule Take 40 mg by mouth every morning.  . Blood Glucose Monitoring Suppl (ACCU-CHEK COMPACT CARE KIT) KIT 1 kit by Does not apply route 4 (four) times daily -  before meals and at bedtime.  . fluticasone (FLOVENT HFA) 110 MCG/ACT inhaler Inhale 1-2 puffs into the lungs daily.  Marland Kitchen glucose blood (ACCU-CHEK GUIDE) test strip Use as instructed  . insulin NPH-regular Human (HUMULIN 70/30) (70-30) 100 UNIT/ML injection Inject 10 Units into the skin 3 (three) times daily before meals.  . Insulin Pen Needle 32G X 4 MM MISC Check blood sugar TID and before meals and at bed time. Administer insulin with needle.  . Insulin Syringe 27G X 1/2" 1 ML MISC Inject 10 Units into  the skin 3 (three) times daily before meals.  . metFORMIN (GLUCOPHAGE-XR) 500 MG 24 hr tablet Take 2 tablets (1,000 mg total) by mouth 2 (two) times daily.  . [DISCONTINUED] STRATTERA 100 MG capsule PO 1x daily   No facility-administered encounter medications on file as of 11/27/2019.     Objective:   Goals Addressed            This Visit's Progress     Patient Stated   . I would like to manage my diabetes (pt-stated)       Current Barriers:  . Diabetes: T2DM;  most recent A1c 13.6% . Current antihyperglycemic regimen: metformin, Humulin 70/30 15 units TID meals o Patient has started insulin: - 1/24 440 346 401 344  - 1/25 285 411 389 455 - 1/26 456, 11am 556 - Increase insulin to 15 units TID meals, will likely have to increase to 20 units TID . Denies hypoglycemic symptoms . Reports hyperglycemic symptoms, including:polyuria, polydipsia, polyphagia, nocturia, blurred vision, neuropathy . Current meal patterns: o Breakfast: biscuits,eggs, bacon o Lunch/supper:eats a lot of chicken and pork, potatoes, fries o Snacks: n/a o Drinks: drinks 2-3 32 oz soft drinks/day . Current exercise: n/a (encouraged) . Current blood glucose readings: 400-600  . Cardiovascular risk reduction: o Current hypertensive regimen: n/a o Current hyperlipidemia regimen:  o Current antiplatelet regimen: n/a  Pharmacist Clinical Goal(s):  Marland Kitchen Over the next 90 days, patient will work with PharmD and primary care provider to address needs related to diabetes management  Interventions: . Comprehensive medication review  performed, medication list updated in electronic medical record . 11/22/19 Call placed to [facility] to confirm medications were ready.  Patient has not been compliant with fills.  Ordered metformin refill.  Ordered syringe refills . Reviewed & discussed the following diabetes-related information with patient: o Continue checking blood sugars as directed o Follow ADA recommended  "diabetes-friendly" diet  (reviewed healthy snack/food options)  Patient Self Care Activities:  . Patient will check blood glucose 4x daily , document, and provide at future appointments . Patient will focus on medication adherence  . Patient will take medications as prescribed . Patient will contact provider with any episodes of hypoglycemia . Patient will report any questions or concerns to provider   Please see past updates related to this goal by clicking on the "Past Updates" button in the selected goal         Plan:   The care management team will reach out to the patient again over the next 5 days.   Provider Signature Regina Eck, PharmD, BCPS Clinical Pharmacist, [place]: [phone]

## 2019-11-29 ENCOUNTER — Ambulatory Visit: Payer: Self-pay | Admitting: Pharmacist

## 2019-11-29 DIAGNOSIS — E119 Type 2 diabetes mellitus without complications: Secondary | ICD-10-CM

## 2019-11-29 NOTE — Progress Notes (Signed)
  Chronic Care Management   Initial Visit Note  11/22/2019 Name: Isaac Weiss MRN: [id number] DOB: May 15, 1981  Referred by: Isaac Fee, DO Reason for referral : Chronic Care Management (Diabetes)   Isaac Weiss is a 39 y.o. year old male who is a primary care patient of [place], Isaac Plummer, DO. The CCM team was consulted for assistance with chronic disease management and care coordination needs related to DMII  Review of patient status, including review of consultants reports, relevant laboratory and other test results, and collaboration with appropriate care team members and the patient's provider was performed as part of comprehensive patient evaluation and provision of chronic care management services.     Medications: Outpatient Encounter Medications as of 11/22/2019  Medication Sig  . Accu-Chek FastClix Lancets MISC Check blood sugar 4 times daily before meal and at bedtime  . albuterol (VENTOLIN HFA) 108 (90 Base) MCG/ACT inhaler Inhale 2 puffs into the lungs every 6 (six) hours as needed for wheezing or shortness of breath.  . Blood Glucose Monitoring Suppl (ACCU-CHEK COMPACT CARE KIT) KIT 1 kit by Does not apply route 4 (four) times daily -  before meals and at bedtime.  . fluticasone (FLOVENT HFA) 110 MCG/ACT inhaler Inhale 1-2 puffs into the lungs daily.  Marland Kitchen gabapentin (NEURONTIN) 300 MG capsule Take 1 capsule (300 mg total) by mouth 3 (three) times daily.  Marland Kitchen glucose blood (ACCU-CHEK GUIDE) test strip Use as instructed  . insulin NPH-regular Human (HUMULIN 70/30) (70-30) 100 UNIT/ML injection Inject 10 Units into the skin 3 (three) times daily before meals.  . Insulin Pen Needle 32G X 4 MM MISC Check blood sugar TID and before meals and at bed time. Administer insulin with needle.  . Insulin Syringe 27G X 1/2" 1 ML MISC Inject 10 Units into the skin 3 (three) times daily before meals.  . metFORMIN (GLUCOPHAGE-XR) 500 MG 24 hr tablet Take 2 tablets (1,000 mg total) by  mouth 2 (two) times daily.   No facility-administered encounter medications on file as of 11/22/2019.     Objective:   Goals Addressed            This Visit's Progress     Patient Stated   . I would like to manage my diabetes (pt-stated)          Plan:   The care management team will reach out to the patient again over the next 7 days.   Provider Signature  Regina Eck, PharmD, BCPS Clinical Pharmacist, [place]: [phone]

## 2019-11-30 NOTE — Progress Notes (Signed)
Chronic Care Management    Visit Note  11/29/2019 Name: Isaac Weiss MRN: [id number] DOB: 12-07-1980  Referred by: Gerlene Fee, DO Reason for referral : Chronic Care Management (Diabetes)   Isaac Weiss is a 39 y.o. year old male who is a primary care patient of [place], Naaman Plummer, DO. The CCM team was consulted for assistance with chronic disease management and care coordination needs related to DMII  Review of patient status, including review of consultants reports, relevant laboratory and other test results, and collaboration with appropriate care team members and the patient's provider was performed as part of comprehensive patient evaluation and provision of chronic care management services.    Medications: Outpatient Encounter Medications as of 11/29/2019  Medication Sig  . Accu-Chek FastClix Lancets MISC Check blood sugar 4 times daily before meal and at bedtime  . albuterol (VENTOLIN HFA) 108 (90 Base) MCG/ACT inhaler Inhale 2 puffs into the lungs every 6 (six) hours as needed for wheezing or shortness of breath.  . ARIPiprazole (ABILIFY) 10 MG tablet Take 10 mg by mouth at bedtime.  Marland Kitchen atomoxetine (STRATTERA) 40 MG capsule Take 40 mg by mouth every morning.  . Blood Glucose Monitoring Suppl (ACCU-CHEK COMPACT CARE KIT) KIT 1 kit by Does not apply route 4 (four) times daily -  before meals and at bedtime.  . fluticasone (FLOVENT HFA) 110 MCG/ACT inhaler Inhale 1-2 puffs into the lungs daily.  Marland Kitchen gabapentin (NEURONTIN) 300 MG capsule Take 1 capsule (300 mg total) by mouth 3 (three) times daily.  Marland Kitchen glucose blood (ACCU-CHEK GUIDE) test strip Use as instructed  . insulin NPH-regular Human (HUMULIN 70/30) (70-30) 100 UNIT/ML injection Inject 10 Units into the skin 3 (three) times daily before meals.  . Insulin Pen Needle 32G X 4 MM MISC Check blood sugar TID and before meals and at bed time. Administer insulin with needle.  . Insulin Syringe 27G X 1/2" 1 ML MISC Inject 10  Units into the skin 3 (three) times daily before meals.  . metFORMIN (GLUCOPHAGE-XR) 500 MG 24 hr tablet Take 2 tablets (1,000 mg total) by mouth 2 (two) times daily.   No facility-administered encounter medications on file as of 11/29/2019.     Objective:   Goals Addressed            This Visit's Progress     Patient Stated   . I would like to manage my diabetes (pt-stated)       Current Barriers:  . Diabetes: T2DM;  most recent A1c 13.6% . Current antihyperglycemic regimen: metformin, Humulin 70/30 15 units TID meals o Patient has started insulin: - 1/24 440 346 401 344  - 1/25 285 411 389 455 - 1/26 456, 11am 556 - Patient still running in 400s - Increase insulin to 20 units TID meals . Denies hypoglycemic symptoms . Reports hyperglycemic symptoms, including:polyuria, polydipsia, polyphagia, nocturia, blurred vision, neuropathy . Current meal patterns: o Breakfast: biscuits,eggs, bacon o Lunch/supper:eats a lot of chicken and pork, potatoes, fries o Snacks: n/a o Drinks: drinks 2-3 32 oz soft drinks/day . Current exercise: n/a (encouraged) . Current blood glucose readings: 400-600  . Cardiovascular risk reduction: o Current hypertensive regimen: n/a o Current hyperlipidemia regimen:  o Current antiplatelet regimen: n/a  Pharmacist Clinical Goal(s):  Marland Kitchen Over the next 90 days, patient will work with PharmD and primary care provider to address needs related to diabetes management  Interventions: . Comprehensive medication review performed, medication list updated in electronic medical record . 11/22/19 Call placed  to [facility] to confirm medications were ready.  Patient has not been compliant with fills.  Ordered metformin refill.  Ordered syringe refills . Reviewed & discussed the following diabetes-related information with patient: o Continue checking blood sugars as directed o Follow ADA recommended "diabetes-friendly" diet  (reviewed healthy snack/food  options)  Patient Self Care Activities:  . Patient will check blood glucose 4x daily , document, and provide at future appointments . Patient will focus on medication adherence  . Patient will take medications as prescribed . Patient will contact provider with any episodes of hypoglycemia . Patient will report any questions or concerns to provider   Please see past updates related to this goal by clicking on the "Past Updates" button in the selected goal         Plan:   The care management team will reach out to the patient again over the next 7 days.   Provider Signature Regina Eck, PharmD, BCPS Clinical Pharmacist, [place]: [phone]

## 2019-12-06 ENCOUNTER — Ambulatory Visit: Payer: Self-pay | Admitting: Pharmacist

## 2019-12-06 ENCOUNTER — Other Ambulatory Visit: Payer: Self-pay | Admitting: Pharmacist

## 2019-12-06 DIAGNOSIS — E119 Type 2 diabetes mellitus without complications: Secondary | ICD-10-CM

## 2019-12-06 MED ORDER — HUMULIN 70/30 (70-30) 100 UNIT/ML ~~LOC~~ SUSP: 25.0000 [IU] | Freq: Three times a day (TID) | SUBCUTANEOUS | 11 refills | Status: DC

## 2019-12-06 NOTE — Telephone Encounter (Signed)
Dr. Salvadore Dom,  Patient called blood sugars are running 200-300 (this is an improvement)--increasing 70/30 insulin to 25 units TID. He continues on metformin, but not always compliant.  I'd like to add a GLP1 to regimen.  He also states he is not being compliant with diet/exercise.    Thank you! Germain Osgood, PharmD, BCPS Clinical Pharmacist, [facility]  [facility]  Direct Dial: [phone]

## 2019-12-10 NOTE — Progress Notes (Signed)
Chronic Care Management    Visit Note  12/06/2019 Name: Isaac Weiss MRN: [id number] DOB: 28-Dec-1980  Referred by: Gerlene Fee, DO Reason for referral : Chronic Care Management (Diabetes)   Isaac Weiss is a 39 y.o. year old male who is a primary care patient of [place], Naaman Plummer, DO. The CCM team was consulted for assistance with chronic disease management and care coordination needs related to DMII  Review of patient status, including review of consultants reports, relevant laboratory and other test results, and collaboration with appropriate care team members and the patient's provider was performed as part of comprehensive patient evaluation and provision of chronic care management services.    Patient was contacted via telephone for today's visit regarding his diabetes and chronic care management   Medications: Outpatient Encounter Medications as of 12/06/2019  Medication Sig  . Accu-Chek FastClix Lancets MISC Check blood sugar 4 times daily before meal and at bedtime  . albuterol (VENTOLIN HFA) 108 (90 Base) MCG/ACT inhaler Inhale 2 puffs into the lungs every 6 (six) hours as needed for wheezing or shortness of breath.  . ARIPiprazole (ABILIFY) 10 MG tablet Take 10 mg by mouth at bedtime.  Marland Kitchen atomoxetine (STRATTERA) 40 MG capsule Take 40 mg by mouth every morning.  . Blood Glucose Monitoring Suppl (ACCU-CHEK COMPACT CARE KIT) KIT 1 kit by Does not apply route 4 (four) times daily -  before meals and at bedtime.  . fluticasone (FLOVENT HFA) 110 MCG/ACT inhaler Inhale 1-2 puffs into the lungs daily.  Marland Kitchen gabapentin (NEURONTIN) 300 MG capsule Take 1 capsule (300 mg total) by mouth 3 (three) times daily.  Marland Kitchen glucose blood (ACCU-CHEK GUIDE) test strip Use as instructed  . insulin NPH-regular Human (HUMULIN 70/30) (70-30) 100 UNIT/ML injection Inject 25 Units into the skin 3 (three) times daily before meals.  . Insulin Pen Needle 32G X 4 MM MISC Check blood sugar TID and before  meals and at bed time. Administer insulin with needle.  . Insulin Syringe 27G X 1/2" 1 ML MISC Inject 10 Units into the skin 3 (three) times daily before meals.  . metFORMIN (GLUCOPHAGE-XR) 500 MG 24 hr tablet Take 2 tablets (1,000 mg total) by mouth 2 (two) times daily.   No facility-administered encounter medications on file as of 12/06/2019.     Objective:   Goals Addressed            This Visit's Progress     Patient Stated   . I would like to manage my diabetes (pt-stated)       Current Barriers:  . Diabetes: T2DM;  most recent A1c 13.6% . Current antihyperglycemic regimen: metformin, Humulin 70/30 15 units TID meals o Patient has started insulin: - Patient Bgs 200-300s - Increase insulin to 30 units TID meals - Consider GLP1-will discuss with PCP . Denies hypoglycemic symptoms . Reports hyperglycemic symptoms, including:polyuria, polydipsia, polyphagia, nocturia, blurred vision, neuropathy . Current meal patterns: o Breakfast: biscuits,eggs, bacon o Lunch/supper:eats a lot of chicken and pork, potatoes, fries o Snacks: n/a o Drinks: drinks 2-3 32 oz soft drinks/day . Current exercise: n/a (encouraged) . Current blood glucose readings: 200-300 . Cardiovascular risk reduction: o Current hypertensive regimen: n/a o Current hyperlipidemia regimen:  o Current antiplatelet regimen: n/a  Pharmacist Clinical Goal(s):  Marland Kitchen Over the next 90 days, patient will work with PharmD and primary care provider to address needs related to diabetes management  Interventions: . Comprehensive medication review performed, medication list updated in electronic medical record . 11/22/19  Call placed to [facility] to confirm medications were ready.  Patient has not been compliant with fills.  Ordered metformin refill.  Ordered syringe refills . Reviewed & discussed the following diabetes-related information with patient: o Continue checking blood sugars as directed o Follow ADA recommended  "diabetes-friendly" diet  (reviewed healthy snack/food options)  Patient Self Care Activities:  . Patient will check blood glucose 4x daily , document, and provide at future appointments . Patient will focus on medication adherence  . Patient will take medications as prescribed . Patient will contact provider with any episodes of hypoglycemia . Patient will report any questions or concerns to provider   Please see past updates related to this goal by clicking on the "Past Updates" button in the selected goal          Plan:   The care management team will reach out to the patient again over the next 30 days.   Provider Signature Regina Eck, PharmD, BCPS Clinical Pharmacist, [place]: [phone]

## 2019-12-12 ENCOUNTER — Other Ambulatory Visit: Payer: Self-pay | Admitting: Family Medicine

## 2019-12-12 DIAGNOSIS — E119 Type 2 diabetes mellitus without complications: Secondary | ICD-10-CM

## 2019-12-12 MED ORDER — HUMULIN 70/30 (70-30) 100 UNIT/ML ~~LOC~~ SUSP: 30.0000 [IU] | Freq: Three times a day (TID) | SUBCUTANEOUS | 11 refills | Status: DC

## 2019-12-12 NOTE — Progress Notes (Signed)
Change Humulin 70/30 order to reflect 30 units TID

## 2020-01-10 ENCOUNTER — Ambulatory Visit: Payer: Self-pay | Admitting: Pharmacist

## 2020-01-10 DIAGNOSIS — E119 Type 2 diabetes mellitus without complications: Secondary | ICD-10-CM

## 2020-01-15 ENCOUNTER — Ambulatory Visit: Payer: Self-pay | Admitting: Pharmacist

## 2020-01-15 NOTE — Progress Notes (Signed)
  Chronic Care Management   Outreach Note  01/15/2020 Name: Isaac Weiss MRN: [id number] DOB: [birth year]  Referred by: Lavonda Jumbo, DO Reason for referral : Chronic Care Management and Diabetes   A second unsuccessful telephone outreach was attempted today. The patient was referred to the case management team for assistance with care management and care coordination.   Follow Up Plan: A HIPPA compliant phone message was left for the patient providing contact information and requesting a return call.  The care management team will reach out to the patient again over the next 7 days.    SIGNATURE Kieth Brightly, PharmD, BCPS Clinical Pharmacist, [facility]   [facility]  Direct Dial: [phone]

## 2020-01-15 NOTE — Progress Notes (Signed)
  Chronic Care Management   Outreach Note  01/10/2020 Name: Junah Yam MRN: [id number] DOB: [birth year]  Referred by: Lavonda Jumbo, DO Reason for referral : Chronic Care Management and Diabetes   An unsuccessful telephone outreach was attempted today. The patient was referred to the case management team for assistance with care management and care coordination.   Follow Up Plan: A HIPPA compliant phone message was left for the patient providing contact information and requesting a return call.  The care management team will reach out to the patient again over the next 7-10 days.   SIGNATURE Kieth Brightly, PharmD, BCPS Clinical Pharmacist, [facility] [place]  [facility]  Direct Dial: [phone]

## 2020-01-31 ENCOUNTER — Ambulatory Visit: Payer: Medicaid Other | Attending: Internal Medicine

## 2020-01-31 DIAGNOSIS — Z23 Encounter for immunization: Secondary | ICD-10-CM

## 2020-01-31 NOTE — Progress Notes (Signed)
   Covid-19 Vaccination Clinic  Name:  Isaac Weiss    MRN: [id number] DOB: [birth year]  01/31/2020  Mr. Isaac Weiss was observed post Covid-19 immunization for 15 minutes without incident. He was provided with Vaccine Information Sheet and instruction to access the V-Safe system.   Isaac Weiss was instructed to call 911 with any severe reactions post vaccine: Marland Kitchen Difficulty breathing  . Swelling of face and throat  . A fast heartbeat  . A bad rash all over body  . Dizziness and weakness   Immunizations Administered    Name Date Dose VIS Date Route   Pfizer COVID-19 Vaccine 01/31/2020  1:24 PM 0.3 mL 10/12/2019 Intramuscular   Manufacturer: [employer], [employer]   Lot: [id number]   NDC: [address]-0    pt reported no issues

## 2020-02-25 ENCOUNTER — Ambulatory Visit: Payer: Medicaid Other | Attending: Internal Medicine

## 2020-02-25 ENCOUNTER — Encounter (INDEPENDENT_AMBULATORY_CARE_PROVIDER_SITE_OTHER): Payer: Medicaid Other | Admitting: Ophthalmology

## 2020-02-25 DIAGNOSIS — H3581 Retinal edema: Secondary | ICD-10-CM

## 2020-02-25 DIAGNOSIS — Z23 Encounter for immunization: Secondary | ICD-10-CM

## 2020-02-25 NOTE — Progress Notes (Signed)
   Covid-19 Vaccination Clinic  Name:  Isaac Weiss    MRN: [id number] DOB: 08/23/1981  02/25/2020  Mr. Isaac Weiss was observed post Covid-19 immunization for 15 minutes without incident. He was provided with Vaccine Information Sheet and instruction to access the V-Safe system.   Mr. Isaac Weiss was instructed to call 911 with any severe reactions post vaccine: Marland Kitchen Difficulty breathing  . Swelling of face and throat  . A fast heartbeat  . A bad rash all over body  . Dizziness and weakness   Immunizations Administered    Name Date Dose VIS Date Route   Pfizer COVID-19 Vaccine 02/25/2020 10:18 AM 0.3 mL 12/26/2018 Intramuscular   Manufacturer: [employer], [employer]   Lot: [id number]   NDC: [address]-2

## 2020-02-26 ENCOUNTER — Other Ambulatory Visit: Payer: Self-pay | Admitting: Family Medicine

## 2020-02-26 DIAGNOSIS — E119 Type 2 diabetes mellitus without complications: Secondary | ICD-10-CM

## 2020-02-26 DIAGNOSIS — J449 Chronic obstructive pulmonary disease, unspecified: Secondary | ICD-10-CM

## 2020-03-06 ENCOUNTER — Encounter (INDEPENDENT_AMBULATORY_CARE_PROVIDER_SITE_OTHER): Payer: Medicaid Other | Admitting: Ophthalmology

## 2020-03-06 DIAGNOSIS — H3581 Retinal edema: Secondary | ICD-10-CM

## 2020-05-07 ENCOUNTER — Encounter (INDEPENDENT_AMBULATORY_CARE_PROVIDER_SITE_OTHER): Payer: Medicaid Other | Admitting: Ophthalmology

## 2020-05-15 NOTE — Progress Notes (Signed)
[facility] Note  05/20/2020     CHIEF COMPLAINT Patient presents for Retina Evaluation   HISTORY OF PRESENT ILLNESS: Isaac Weiss is a 39 y.o. male who presents to the clinic today for:   HPI    Retina Evaluation    In both eyes.  Duration of 1 year.  Context:  watching TV.  I, the attending physician,  performed the HPI with the patient and updated documentation appropriately.          Comments    Retina eval per Dr. 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  Patient states he had noticed a change in his vision x1 year.  Glasses has improved some.  DM 2 x10 yrs. BS: 341 this am A1C: 13.6        Last edited by Bernarda Caffey, MD on 05/21/2020 10:57 PM. (History)    pt is here on the referral of Dr. Shirley Muscat for concern of NPDR OU and ARMD OS, he states he went to see him for routine eye exam due to blurry and double vision, pt states he did not get a new glasses rx, pt endorses being diabetic, he states at his last appt with Shirley Muscat he "thinks" he had an eye infection bc his eyes were leaking "green and yellow" fluid, pt states his blood sugar fluctuates from 97-300, which is an improvement from 500, last A1c was 13.6 in January 2021, pt thinks he may have seen his PCP in May  Referring physician: Calton Dach, MD [id number],  [place] [id number]  HISTORICAL INFORMATION:   Selected notes from the MEDICAL RECORD NUMBER Referred by Dr. Shirley Muscat on 02/04/20 for eval of dry ARMD OS [facility] [place] OD: 20/50- OS: 20/200 IOPs 21,16 Pt is Type II DM Cortical cataract OU    CURRENT MEDICATIONS: No current outpatient medications on file. (Ophthalmic Drugs)   No current facility-administered medications for this visit. (Ophthalmic Drugs)   Current Outpatient Medications (Other)  Medication Sig  . albuterol (VENTOLIN HFA) 108 (90 Base) MCG/ACT inhaler Inhale 2 puffs into the lungs every 6 hours as needed for wheezing or shortness of  breath.  . haloperidol (HALDOL) 10 MG tablet Take 5 mg by mouth 2 (two) times daily.  . metFORMIN (GLUCOPHAGE-XR) 500 MG 24 hr tablet TAKE 2 TABLETS BY MOUTH 2 TIMES DAILY  . Accu-Chek FastClix Lancets MISC Check blood sugar 4 times daily before meal and at bedtime  . ARIPiprazole (ABILIFY) 10 MG tablet Take 10 mg by mouth at bedtime.  Marland Kitchen atomoxetine (STRATTERA) 40 MG capsule Take 40 mg by mouth every morning.  . Blood Glucose Monitoring Suppl (ACCU-CHEK COMPACT CARE KIT) KIT 1 kit by Does not apply route 4 (four) times daily -  before meals and at bedtime.  . fluticasone (FLOVENT HFA) 110 MCG/ACT inhaler Inhale 1-2 puffs into the lungs daily.  Marland Kitchen gabapentin (NEURONTIN) 300 MG capsule Take 1 capsule (300 mg total) by mouth 3 (three) times daily.  Marland Kitchen glucose blood (ACCU-CHEK GUIDE) test strip Use as instructed  . insulin NPH-regular Human (HUMULIN 70/30) (70-30) 100 UNIT/ML injection Inject 30 Units into the skin 3 (three) times daily before meals.  . Insulin Pen Needle 32G X 4 MM MISC Check blood sugar TID and before meals and at bed time. Administer insulin with needle.  . Insulin Syringe 27G X 1/2" 1 ML MISC Inject 10 Units into the skin 3 (three) times daily before meals.  No current facility-administered medications for this visit. (Other)      REVIEW OF SYSTEMS: ROS    Positive for: Neurological, Endocrine, Eyes, Respiratory   Negative for: Constitutional, Gastrointestinal, Skin, Genitourinary, Musculoskeletal, HENT, Cardiovascular, Psychiatric, Allergic/Imm, Heme/Lymph   Last edited by Leonie Douglas, COA on 05/20/2020  9:29 AM. (History)       ALLERGIES No Known Allergies  PAST MEDICAL HISTORY Past Medical History:  Diagnosis Date  . Asthma   . COPD (chronic obstructive pulmonary disease) ([place])   . Diabetes mellitus without complication ([place])    History reviewed. No pertinent surgical history.  FAMILY HISTORY History reviewed. No pertinent family history.  SOCIAL  HISTORY Social History   Tobacco Use  . Smoking status: Current Every Day Smoker    Packs/day: 1.00    Types: Cigarettes  Substance Use Topics  . Alcohol use: Not on file  . Drug use: Not on file         OPHTHALMIC EXAM:  Base Eye Exam    Visual Acuity (Snellen - Linear)      Right Left   Dist cc 20/20 20/20   Correction: Glasses       Tonometry (Tonopen, 9:39 AM)      Right Left   Pressure 20 21       Pupils      Dark Light Shape React APD   Right 3 2 Round Brisk None   Left 3 2 Round Brisk None       Visual Fields (Counting fingers)      Left Right    Full Full       Extraocular Movement      Right Left    Full Full       Neuro/Psych    Oriented x3: Yes   Mood/Affect: Normal       Dilation    Both eyes: 1.0% Mydriacyl, 2.5% Phenylephrine @ 9:39 AM        Slit Lamp and Fundus Exam    Slit Lamp Exam      Right Left   Lids/Lashes Dermatochalasis - upper lid Dermatochalasis - upper lid   Conjunctiva/Sclera White and quiet White and quiet   Cornea Trace Debris in tear film Trace Debris in tear film   Anterior Chamber deep and clear deep and clear   Iris No NVI, Round and dilated No NVI, Round and dilated   Lens Clear Clear   Vitreous clear  clear        Fundus Exam      Right Left   Disc Pink and Sharp Pink and Sharp   C/D Ratio 0.3 0.5   Macula Flat, Good foveal reflex, rare MA, No edema Flat, Good foveal reflex, focal cluster of Mas and cystic changes IT fovea, rare scattered MA throughout macula, No edema   Vessels mild tortuousity mild tortuousity   Periphery Attached, rare MA   Attached, rare MA          Refraction    Wearing Rx      Sphere Cylinder Axis   Right -1.25 +1.75 074   Left [place] +1.75 106          IMAGING AND PROCEDURES  Imaging and Procedures for 05/20/2020  OCT, Retina - OU - Both Eyes       Right Eye Quality was good. Central Foveal Thickness: 255. Progression has no prior data. Findings include normal  foveal contour, no IRF, no SRF.   Left Eye Quality was good.  Central Foveal Thickness: 274. Progression has no prior data. Findings include normal foveal contour, no IRF, no SRF (Mild focal cystic changes and IRHM IT fovea).   Notes *Images captured and stored on drive  Diagnosis / Impression:  NFP, no IRF/SRF OU OS: Mild focal cystic changes and IRHM IT fovea  Clinical management:  See below  Abbreviations: NFP - Normal foveal profile. CME - cystoid macular edema. PED - pigment epithelial detachment. IRF - intraretinal fluid. SRF - subretinal fluid. EZ - ellipsoid zone. ERM - epiretinal membrane. ORA - outer retinal atrophy. ORT - outer retinal tubulation. SRHM - subretinal hyper-reflective material. IRHM - intraretinal hyper-reflective material                 ASSESSMENT/PLAN:    ICD-10-CM   1. Mild nonproliferative diabetic retinopathy of both eyes without macular edema associated with type 2 diabetes mellitus ([place])  D63.8756   2. Retinal edema  H35.81 OCT, Retina - OU - Both Eyes    1,2. Mild nonproliferative diabetic retinopathy w/o DME, OU - A1c 13.6 in Jan 2021 - The incidence, risk factors for progression, natural history and treatment options for diabetic retinopathy were discussed with patient.   - The need for close monitoring of blood glucose, blood pressure, and serum lipids, avoiding cigarette or any type of tobacco, and the need for long term follow up was also discussed with patient. - exam shows scattered MA OU - OCT without diabetic macular edema, OU -- tr cystic changes and IRHM OS - f/u in 3 mos -- DFE/OCT/FA (optos, transit OS)    Ophthalmic Meds Ordered this visit:  No orders of the defined types were placed in this encounter.      Return in about 3 months (around 08/20/2020) for f/u NPDR OU, DFE, OCT, FA.  There are no Patient Instructions on file for this visit.   Explained the diagnoses, plan, and follow up with the patient and they  expressed understanding.  Patient expressed understanding of the importance of proper follow up care.  This document serves as a record of services personally performed by Gardiner Sleeper, MD, PhD. It was created on their behalf by Estill Bakes, COT an ophthalmic technician. The creation of this record is the provider's dictation and/or activities during the visit.    Electronically signed by: Estill Bakes, COT 05/15/20 @ 11:12 PM  Gardiner Sleeper, M.D., Ph.D. Diseases & Surgery of the Retina and Vitreous [facility]  I have reviewed the above documentation for accuracy and completeness, and I agree with the above. Gardiner Sleeper, M.D., Ph.D. 05/21/20 11:12 PM   Abbreviations: M myopia (nearsighted); A astigmatism; H hyperopia (farsighted); P presbyopia; Mrx spectacle prescription;  CTL contact lenses; OD right eye; OS left eye; OU both eyes  XT exotropia; ET esotropia; PEK punctate epithelial keratitis; PEE punctate epithelial erosions; DES dry eye syndrome; MGD meibomian gland dysfunction; ATs artificial tears; PFAT's preservative free artificial tears; [place] nuclear sclerotic cataract; PSC posterior subcapsular cataract; ERM epi-retinal membrane; PVD posterior vitreous detachment; RD retinal detachment; DM diabetes mellitus; DR diabetic retinopathy; NPDR non-proliferative diabetic retinopathy; PDR proliferative diabetic retinopathy; CSME clinically significant macular edema; DME diabetic macular edema; dbh dot blot hemorrhages; CWS cotton wool spot; POAG primary open angle glaucoma; C/D cup-to-disc ratio; HVF humphrey visual field; GVF goldmann visual field; OCT optical coherence tomography; IOP intraocular pressure; BRVO Branch retinal vein occlusion; CRVO central retinal vein occlusion; CRAO central retinal artery occlusion; BRAO branch retinal  artery occlusion; RT retinal tear; SB scleral buckle; PPV pars plana vitrectomy; VH Vitreous hemorrhage; PRP panretinal laser  photocoagulation; IVK intravitreal kenalog; VMT vitreomacular traction; MH Macular hole;  NVD neovascularization of the disc; NVE neovascularization elsewhere; AREDS age related eye disease study; ARMD age related macular degeneration; POAG primary open angle glaucoma; EBMD epithelial/anterior basement membrane dystrophy; ACIOL anterior chamber intraocular lens; IOL intraocular lens; PCIOL posterior chamber intraocular lens; Phaco/IOL phacoemulsification with intraocular lens placement; [place] photorefractive keratectomy; LASIK laser assisted in situ keratomileusis; HTN hypertension; DM diabetes mellitus; COPD chronic obstructive pulmonary disease

## 2020-05-20 ENCOUNTER — Ambulatory Visit (INDEPENDENT_AMBULATORY_CARE_PROVIDER_SITE_OTHER): Payer: Medicaid Other | Admitting: Ophthalmology

## 2020-05-20 ENCOUNTER — Other Ambulatory Visit: Payer: Self-pay

## 2020-05-20 DIAGNOSIS — E113293 Type 2 diabetes mellitus with mild nonproliferative diabetic retinopathy without macular edema, bilateral: Secondary | ICD-10-CM

## 2020-05-20 DIAGNOSIS — H3581 Retinal edema: Secondary | ICD-10-CM | POA: Diagnosis not present

## 2020-05-21 ENCOUNTER — Encounter (INDEPENDENT_AMBULATORY_CARE_PROVIDER_SITE_OTHER): Payer: Self-pay | Admitting: Ophthalmology

## 2020-07-30 ENCOUNTER — Encounter: Payer: Self-pay | Admitting: Internal Medicine

## 2020-07-30 ENCOUNTER — Ambulatory Visit (INDEPENDENT_AMBULATORY_CARE_PROVIDER_SITE_OTHER): Payer: Medicaid Other | Admitting: Internal Medicine

## 2020-07-30 ENCOUNTER — Other Ambulatory Visit: Payer: Self-pay

## 2020-07-30 VITALS — BP 142/76 | HR 74 | Temp 97.8°F | Resp 18 | Ht 68.0 in | Wt 294.4 lb

## 2020-07-30 DIAGNOSIS — Z23 Encounter for immunization: Secondary | ICD-10-CM | POA: Diagnosis not present

## 2020-07-30 DIAGNOSIS — Z7689 Persons encountering health services in other specified circumstances: Secondary | ICD-10-CM

## 2020-07-30 DIAGNOSIS — E119 Type 2 diabetes mellitus without complications: Secondary | ICD-10-CM | POA: Diagnosis not present

## 2020-07-30 DIAGNOSIS — J449 Chronic obstructive pulmonary disease, unspecified: Secondary | ICD-10-CM

## 2020-07-30 DIAGNOSIS — Z0100 Encounter for examination of eyes and vision without abnormal findings: Secondary | ICD-10-CM

## 2020-07-30 DIAGNOSIS — G4730 Sleep apnea, unspecified: Secondary | ICD-10-CM

## 2020-07-30 DIAGNOSIS — M25511 Pain in right shoulder: Secondary | ICD-10-CM

## 2020-07-30 DIAGNOSIS — F3160 Bipolar disorder, current episode mixed, unspecified: Secondary | ICD-10-CM

## 2020-07-30 DIAGNOSIS — F17210 Nicotine dependence, cigarettes, uncomplicated: Secondary | ICD-10-CM

## 2020-07-30 DIAGNOSIS — Z720 Tobacco use: Secondary | ICD-10-CM

## 2020-07-30 DIAGNOSIS — I10 Essential (primary) hypertension: Secondary | ICD-10-CM | POA: Insufficient documentation

## 2020-07-30 DIAGNOSIS — F319 Bipolar disorder, unspecified: Secondary | ICD-10-CM | POA: Insufficient documentation

## 2020-07-30 DIAGNOSIS — G8929 Other chronic pain: Secondary | ICD-10-CM

## 2020-07-30 LAB — POCT GLYCOSYLATED HEMOGLOBIN (HGB A1C)
HbA1c POC (<> result, manual entry): 9.4 % (ref 4.0–5.6)
HbA1c, POC (controlled diabetic range): 9.4 % — AB (ref 0.0–7.0)
HbA1c, POC (prediabetic range): 9.4 % — AB (ref 5.7–6.4)
Hemoglobin A1C: 9.4 % — AB (ref 4.0–5.6)

## 2020-07-30 MED ORDER — INSULIN GLARGINE 100 UNIT/ML ~~LOC~~ SOLN: 30.0000 [IU] | Freq: Every day | SUBCUTANEOUS | 2 refills | Status: DC

## 2020-07-30 NOTE — Assessment & Plan Note (Addendum)
Well-controlled On Ventolin inhaler PRN Was given Flovent in the past, noncompliant

## 2020-07-30 NOTE — Assessment & Plan Note (Signed)
Care established Previous chart reviewed History and medications reviewed with the patient 

## 2020-07-30 NOTE — Assessment & Plan Note (Signed)
Asked about quitting: confirms that he currently smokes cigarettes Advise to quit smoking: Educated about QUITTING to reduce the risk of cancer, cardio and cerebrovascular disease. Assess willingness: Unwilling to quit or cut down at this time Assist with counseling and pharmacotherapy: Counseled for 5 minutes and literature provided. Arrange for follow up: Follow up and continue to offer help. 

## 2020-07-30 NOTE — Assessment & Plan Note (Signed)
BP: 142/76 Will repeat BP in next visit Advised to check BP at home and bring the log in next visit Plan to start ACEi/ARB after CMP Advised DASH diet and moderate exercise as tolerated

## 2020-07-30 NOTE — Assessment & Plan Note (Addendum)
On Lithium and Lamictal Takes Atomoxetine for resistant depression Follows up with Psychiatrist

## 2020-07-30 NOTE — Assessment & Plan Note (Signed)
Chronic right shoulder pain associated with right arm and forearm pain and numbness Could be neuropathy from brachial nerve plexus or cervical radiculopathy F/u Right shoulder X-ray, might need MRI Advised Ibuprofen/Tylenol PRN for pain and exercises explained, material provided

## 2020-07-30 NOTE — Patient Instructions (Signed)
Please start taking Lantus 30 U once daily.  Please check blood glucose at home and bring the log in the next visit.  Please apply thumb spica splint as instructed.  Please follow diabetic diet and perform moderate exercise/walking as tolerated.  You are advised to quit smoking as soon as possible.

## 2020-07-30 NOTE — Assessment & Plan Note (Addendum)
HbA1C: 9.4 today in office On Novolin 70/30, takes 30 U TID and Metformin 500 mg QD Added Lantus 30 U qHS Advised to comply with the treatment Advised to follow diabetic diet F/u CMP and lipid panel Diabetic foot exam: In next visit Diabetic eye exam: Advised to follow up with Ophthalmology for diabetic eye exam

## 2020-07-30 NOTE — Assessment & Plan Note (Signed)
Advised diet modification and moderate exercise as tolerated 

## 2020-07-30 NOTE — Assessment & Plan Note (Signed)
Has had sleep study in the past Does not have CPAP machine currently Referred to Pulmonology for sleep study and arrangement for CPAP

## 2020-07-30 NOTE — Progress Notes (Signed)
New Patient Office Visit  Subjective:  Patient ID: Isaac Weiss, male    DOB: 1980/11/30  Age: 39 y.o. MRN: [id number]  CC:  Chief Complaint  Patient presents with  . New Patient (Initial Visit)    new pt former [facility] patient has been having right shoulder pain for some time and also right thumb has been bothering him     HPI Isaac Weiss is a 39 year old male with past medical history of uncontrolled diabetes mellitus, COPD, sleep apnea, bipolar disorder disorder, anxiety, PTSD, tobacco abuse and morbid obesity presents for establishing care.  Patient complains of chronic right shoulder pain, which is intermittent, sharp, radiating to right arm and forearm associated with numbness in the right arm and forearm.  Patient denies any neck pain.  He denies any known injury to the right shoulder.  He has been taking gabapentin for it with minimal benefit.  Patient has a history of uncontrolled diabetes mellitus, for which he takes Humulin 70/30 before meals, 30 units 3 times daily and Metformin 500 mg once daily.  He is HbA1c is noted to be 9.4 in today's visit.  He has not had eye exam for a long time.  Patient follows up with psychiatrist for his bipolar disorder, anxiety and PTSD.  He is on atomoxetine, Lamictal and lithium for it.  Patient was diagnosed with OSA in the past and was given CPAP.  He mentions that he has lost his CPAP machine since he was in jail for a long time.  Patient admits to smoking history for many years, and is reluctant to quit or cut down despite explaining the risks associated with it.  Patient is up to date with Covid vaccinations.  He received flu vaccine in the office today.  Past Medical History:  Diagnosis Date  . Anxiety    Phreesia 07/27/2020  . Arthritis    Phreesia 07/27/2020  . Asthma   . COPD (chronic obstructive pulmonary disease) ([place])   . Depression    Phreesia 07/27/2020  . Diabetes mellitus without complication  ([place])   . Hyperlipidemia    Phreesia 07/27/2020  . Neuromuscular disorder ([place])    Phreesia 07/27/2020  . Sleep apnea    Phreesia 07/27/2020    History reviewed. No pertinent surgical history.  History reviewed. No pertinent family history.  Social History   Socioeconomic History  . Marital status: Married    Spouse name: Not on file  . Number of children: Not on file  . Years of education: Not on file  . Highest education level: Not on file  Occupational History  . Not on file  Tobacco Use  . Smoking status: Current Every Day Smoker    Packs/day: 1.00    Types: Cigarettes  . Smokeless tobacco: Never Used  Substance and Sexual Activity  . Alcohol use: Yes  . Drug use: Never  . Sexual activity: Not on file  Other Topics Concern  . Not on file  Social History Narrative  . Not on file   Social Determinants of Health   Financial Resource Strain:   . Difficulty of Paying Living Expenses: Not on file  Food Insecurity:   . Worried About Charity fundraiser in the Last Year: Not on file  . Ran Out of Food in the Last Year: Not on file  Transportation Needs:   . Lack of Transportation (Medical): Not on file  . Lack of Transportation (Non-Medical): Not on file  Physical Activity:   .  Days of Exercise per Week: Not on file  . Minutes of Exercise per Session: Not on file  Stress:   . Feeling of Stress : Not on file  Social Connections:   . Frequency of Communication with Friends and Family: Not on file  . Frequency of Social Gatherings with Friends and Family: Not on file  . Attends Religious Services: Not on file  . Active Member of Clubs or Organizations: Not on file  . Attends Archivist Meetings: Not on file  . Marital Status: Not on file  Intimate Partner Violence:   . Fear of Current or Ex-Partner: Not on file  . Emotionally Abused: Not on file  . Physically Abused: Not on file  . Sexually Abused: Not on file    ROS Review of Systems    Constitutional: Negative for chills and fever.  HENT: Negative for congestion and sore throat.   Eyes: Negative for pain and discharge.  Respiratory: Negative for cough and shortness of breath.   Cardiovascular: Negative for chest pain and palpitations.  Gastrointestinal: Negative for constipation, diarrhea, nausea and vomiting.  Endocrine: Negative for polydipsia and polyuria.  Genitourinary: Negative for dysuria and hematuria.  Musculoskeletal: Negative for neck pain and neck stiffness.       Right shoulder pain  Skin: Negative for rash.  Neurological: Negative for dizziness, weakness, numbness and headaches.  Psychiatric/Behavioral: Negative for agitation and behavioral problems.    Objective:   Today's Vitals: BP (!) 142/76 (BP Location: Right Arm, Patient Position: Sitting, Cuff Size: Normal)   Pulse 74   Temp 97.8 F (36.6 C) (Temporal)   Resp 18   Ht 5' 8"  (1.727 m)   Wt 294 lb 6.4 oz (133.5 kg)   SpO2 95%   BMI 44.76 kg/m   Physical Exam Vitals reviewed.  Constitutional:      General: He is not in acute distress.    Appearance: He is obese. He is not diaphoretic.  HENT:     Head: Normocephalic and atraumatic.     Nose: Nose normal.     Mouth/Throat:     Mouth: Mucous membranes are moist.  Eyes:     General: No scleral icterus.    Extraocular Movements: Extraocular movements intact.     Pupils: Pupils are equal, round, and reactive to light.  Cardiovascular:     Rate and Rhythm: Normal rate and regular rhythm.     Pulses: Normal pulses.     Heart sounds: No murmur heard.   Pulmonary:     Breath sounds: Normal breath sounds. No wheezing or rales.  Abdominal:     Palpations: Abdomen is soft.     Tenderness: There is no abdominal tenderness.  Musculoskeletal:     Cervical back: Neck supple. No tenderness.     Right lower leg: No edema.     Left lower leg: No edema.  Skin:    General: Skin is warm.     Findings: No rash.  Neurological:     General: No  focal deficit present.     Mental Status: He is alert and oriented to person, place, and time.  Psychiatric:        Mood and Affect: Mood normal.        Behavior: Behavior normal.     Assessment & Plan:   Encounter to establish care Care established Previous chart reviewed History and medications reviewed with the patient  Diabetes mellitus without complication (HCC) MHW8G: 9.4 today in office On  Novolin 70/30, takes 30 U TID and Metformin 500 mg QD Added Lantus 30 U qHS Advised to comply with the treatment Advised to follow diabetic diet F/u CMP and lipid panel Diabetic foot exam: In next visit Diabetic eye exam: Advised to follow up with Ophthalmology for diabetic eye exam    COPD (chronic obstructive pulmonary disease) ([place]) Well-controlled On Ventolin inhaler PRN Was given Flovent in the past, noncompliant  Sleep apnea Has had sleep study in the past Does not have CPAP machine currently Referred to Pulmonology for sleep study and arrangement for CPAP  Shoulder pain, right Chronic right shoulder pain associated with right arm and forearm pain and numbness Could be neuropathy from brachial nerve plexus or cervical radiculopathy F/u Right shoulder X-ray, might need MRI Advised Ibuprofen/Tylenol PRN for pain and exercises explained, material provided  Bipolar disorder ([place]) On Lithium and Lamictal Takes Atomoxetine for resistant depression Follows up with Psychiatrist  Tobacco abuse Asked about quitting: confirms that he currently smokes cigarettes Advise to quit smoking: Educated about QUITTING to reduce the risk of cancer, cardio and cerebrovascular disease. Assess willingness: Unwilling to quit or cut down at this time Assist with counseling and pharmacotherapy: Counseled for 5 minutes and literature provided. Arrange for follow up: Follow up and continue to offer help.   Morbid obesity ([place]) Advised diet modification and moderate exercise as  tolerated  Hypertension BP: 142/76 Will repeat BP in next visit Advised to check BP at home and bring the log in next visit Plan to start ACEi/ARB after CMP Advised DASH diet and moderate exercise as tolerated     Other Visit Diagnoses    Need for immunization against influenza       Relevant Orders   Flu Vaccine QUAD 36+ mos IM (Completed)   Eye exam, routine       Relevant Orders   Ambulatory referral to Ophthalmology    Outpatient Encounter Medications as of 07/30/2020  Medication Sig  . Accu-Chek FastClix Lancets MISC Check blood sugar 4 times daily before meal and at bedtime  . albuterol (VENTOLIN HFA) 108 (90 Base) MCG/ACT inhaler Inhale 2 puffs into the lungs every 6 hours as needed for wheezing or shortness of breath.  Marland Kitchen atomoxetine (STRATTERA) 40 MG capsule Take 40 mg by mouth every morning.  . Blood Glucose Monitoring Suppl (ACCU-CHEK COMPACT CARE KIT) KIT 1 kit by Does not apply route 4 (four) times daily -  before meals and at bedtime.  . fluticasone (FLOVENT HFA) 110 MCG/ACT inhaler Inhale 1-2 puffs into the lungs daily.  Marland Kitchen gabapentin (NEURONTIN) 300 MG capsule Take 1 capsule (300 mg total) by mouth 3 (three) times daily.  Marland Kitchen glucose blood (ACCU-CHEK GUIDE) test strip Use as instructed  . hydrOXYzine (VISTARIL) 25 MG capsule Take 25 mg by mouth 3 (three) times daily as needed.  . insulin NPH-regular Human (HUMULIN 70/30) (70-30) 100 UNIT/ML injection Inject 30 Units into the skin 3 (three) times daily before meals.  . Insulin Pen Needle 32G X 4 MM MISC Check blood sugar TID and before meals and at bed time. Administer insulin with needle.  . Insulin Syringe 27G X 1/2" 1 ML MISC Inject 10 Units into the skin 3 (three) times daily before meals.  . lamoTRIgine (LAMICTAL) 100 MG tablet Take 100 mg by mouth 2 (two) times daily.  Marland Kitchen lithium carbonate (ESKALITH) 450 MG CR tablet Take 900 mg by mouth daily.  . metFORMIN (GLUCOPHAGE-XR) 500 MG 24 hr tablet TAKE 2 TABLETS BY  MOUTH  2 TIMES DAILY  . prazosin (MINIPRESS) 1 MG capsule Take 2 mg by mouth at bedtime.  . propranolol (INDERAL) 20 MG tablet Take 20 mg by mouth 3 (three) times daily.  . insulin glargine (LANTUS) 100 UNIT/ML injection Inject 0.3 mLs (30 Units total) into the skin daily.  . [DISCONTINUED] ARIPiprazole (ABILIFY) 10 MG tablet Take 10 mg by mouth at bedtime. (Patient not taking: Reported on 07/30/2020)  . [DISCONTINUED] haloperidol (HALDOL) 10 MG tablet Take 5 mg by mouth 2 (two) times daily. (Patient not taking: Reported on 07/30/2020)   No facility-administered encounter medications on file as of 07/30/2020.    Follow-up: Return in about 6 weeks (around 09/10/2020).   Lindell Spar, MD

## 2020-08-14 ENCOUNTER — Other Ambulatory Visit: Payer: Self-pay | Admitting: Family Medicine

## 2020-08-14 DIAGNOSIS — M5410 Radiculopathy, site unspecified: Secondary | ICD-10-CM

## 2020-08-14 DIAGNOSIS — E119 Type 2 diabetes mellitus without complications: Secondary | ICD-10-CM

## 2020-08-20 ENCOUNTER — Encounter (INDEPENDENT_AMBULATORY_CARE_PROVIDER_SITE_OTHER): Payer: Medicaid Other | Admitting: Ophthalmology

## 2020-08-20 DIAGNOSIS — I10 Essential (primary) hypertension: Secondary | ICD-10-CM

## 2020-08-20 DIAGNOSIS — H3581 Retinal edema: Secondary | ICD-10-CM

## 2020-08-20 DIAGNOSIS — E113293 Type 2 diabetes mellitus with mild nonproliferative diabetic retinopathy without macular edema, bilateral: Secondary | ICD-10-CM

## 2020-08-20 DIAGNOSIS — H35033 Hypertensive retinopathy, bilateral: Secondary | ICD-10-CM

## 2020-09-08 DIAGNOSIS — Z8619 Personal history of other infectious and parasitic diseases: Secondary | ICD-10-CM | POA: Insufficient documentation

## 2020-09-10 ENCOUNTER — Ambulatory Visit: Payer: Medicaid Other | Admitting: Internal Medicine

## 2020-09-15 ENCOUNTER — Other Ambulatory Visit: Payer: Self-pay

## 2020-09-15 ENCOUNTER — Ambulatory Visit (HOSPITAL_COMMUNITY)
Admission: RE | Admit: 2020-09-15 | Discharge: 2020-09-15 | Disposition: A | Discharge status: Home / Self Care | Payer: Medicaid Other | Source: Ambulatory Visit | Attending: Internal Medicine | Admitting: Internal Medicine

## 2020-09-15 DIAGNOSIS — M25511 Pain in right shoulder: Secondary | ICD-10-CM | POA: Insufficient documentation

## 2020-09-17 ENCOUNTER — Encounter: Payer: Self-pay | Admitting: Internal Medicine

## 2020-09-17 ENCOUNTER — Other Ambulatory Visit: Payer: Self-pay

## 2020-09-17 ENCOUNTER — Ambulatory Visit (INDEPENDENT_AMBULATORY_CARE_PROVIDER_SITE_OTHER): Payer: Medicaid Other | Admitting: Internal Medicine

## 2020-09-17 VITALS — BP 108/75 | HR 91 | Temp 99.0°F | Resp 18 | Ht 68.0 in | Wt 301.1 lb

## 2020-09-17 DIAGNOSIS — E119 Type 2 diabetes mellitus without complications: Secondary | ICD-10-CM

## 2020-09-17 DIAGNOSIS — G4730 Sleep apnea, unspecified: Secondary | ICD-10-CM | POA: Diagnosis not present

## 2020-09-17 DIAGNOSIS — M25511 Pain in right shoulder: Secondary | ICD-10-CM

## 2020-09-17 DIAGNOSIS — G8929 Other chronic pain: Secondary | ICD-10-CM

## 2020-09-17 DIAGNOSIS — J449 Chronic obstructive pulmonary disease, unspecified: Secondary | ICD-10-CM | POA: Diagnosis not present

## 2020-09-17 DIAGNOSIS — Z720 Tobacco use: Secondary | ICD-10-CM

## 2020-09-17 DIAGNOSIS — F3160 Bipolar disorder, current episode mixed, unspecified: Secondary | ICD-10-CM

## 2020-09-17 MED ORDER — GLIPIZIDE 10 MG PO TABS: 10.0000 mg | ORAL_TABLET | Freq: Two times a day (BID) | ORAL | 3 refills | Status: DC

## 2020-09-17 NOTE — Assessment & Plan Note (Signed)
On Lithium and Lamictal Takes Atomoxetine for resistant depression Recently started [employer] up with Psychiatrist

## 2020-09-17 NOTE — Assessment & Plan Note (Signed)
Has had sleep study in the past Does not have CPAP machine currently Referred to Pulmonology for sleep study and arrangement for CPAP --> will try to follow up with office to get sooner appointment

## 2020-09-17 NOTE — Assessment & Plan Note (Signed)
HbA1C: 9.4 today in office Home blood glucose readings reviewed, around 200 most of the time. On Novolin 70/30, takes 10 U TID and Metformin 500 mg QD Continue Lantus 30 U qHS Added Glipizide Advised to comply with the treatment Advised to follow diabetic diet F/u CMP and lipid panel

## 2020-09-17 NOTE — Assessment & Plan Note (Signed)
Well-controlled °On Flovent and Ventolin inhaler PRN °

## 2020-09-17 NOTE — Assessment & Plan Note (Signed)
X-ray of the right shoulder negative for fracture. Ordered MRI of the right shoulder Tylenol PRN

## 2020-09-17 NOTE — Assessment & Plan Note (Signed)
Asked about quitting: confirms that he currently smokes cigarettes Advise to quit smoking: Educated about QUITTING to reduce the risk of cancer, cardio and cerebrovascular disease. Assess willingness: Unwilling to quit or cut down at this time Assist with counseling and pharmacotherapy: Counseled for 5 minutes and literature provided. Arrange for follow up: Follow up and continue to offer help. 

## 2020-09-17 NOTE — Progress Notes (Signed)
Established Patient Office Visit  Subjective:  Patient ID: Isaac Weiss, male    DOB: 02/21/1981  Age: 39 y.o. MRN: [id number]  CC:  Chief Complaint  Patient presents with  . Follow-up    6 week follow up pt is still having right shoulder pain used to be on and off now is constant     HPI Isaac Weiss  is a 39 year old male with past medical history of uncontrolled diabetes mellitus, COPD, sleep apnea, bipolar disorder disorder, anxiety, PTSD, tobacco abuse and morbid obesity who presents for follow up of his chronic conditions.  Patient has started taking Lantus and takes insulin regularly, but states that he takes less insulin sometimes as he is worried about hypoglycemia. He has brought home blood glucose readings, which are between 150-250 most of the times. He tries to follow diabetic diet, but is not completely compliant. He denies polyuria, polyphagia or urinary frequency.  Patient c/o right shoulder pain, which is chronic. X-ray of the right shoulder was found to be negative for fracture. He also c/o mild numbness in the right arm, which is intermittent.  Patient has used CPAP for his OSA in the past, but currently does not have CPAP use. Patient has not had Pulmonology visit yet as he missed a call from their office.  Patient has been smoking 0.5 pack/day cigarettes, and has been trying to cut down. Reluctant to quit at this time.  Past Medical History:  Diagnosis Date  . Anxiety    Phreesia 07/27/2020  . Arthritis    Phreesia 07/27/2020  . Asthma   . COPD (chronic obstructive pulmonary disease) ([place])   . Depression    Phreesia 07/27/2020  . Diabetes mellitus without complication ([place])   . Hyperlipidemia    Phreesia 07/27/2020  . Neuromuscular disorder ([place])    Phreesia 07/27/2020  . Sleep apnea    Phreesia 07/27/2020    History reviewed. No pertinent surgical history.  History reviewed. No pertinent family history.  Social History   Socioeconomic  History  . Marital status: Married    Spouse name: Not on file  . Number of children: Not on file  . Years of education: Not on file  . Highest education level: Not on file  Occupational History  . Not on file  Tobacco Use  . Smoking status: Current Every Day Smoker    Packs/day: 1.00    Types: Cigarettes  . Smokeless tobacco: Never Used  Substance and Sexual Activity  . Alcohol use: Yes  . Drug use: Never  . Sexual activity: Not on file  Other Topics Concern  . Not on file  Social History Narrative  . Not on file   Social Determinants of Health   Financial Resource Strain:   . Difficulty of Paying Living Expenses: Not on file  Food Insecurity:   . Worried About Charity fundraiser in the Last Year: Not on file  . Ran Out of Food in the Last Year: Not on file  Transportation Needs:   . Lack of Transportation (Medical): Not on file  . Lack of Transportation (Non-Medical): Not on file  Physical Activity:   . Days of Exercise per Week: Not on file  . Minutes of Exercise per Session: Not on file  Stress:   . Feeling of Stress : Not on file  Social Connections:   . Frequency of Communication with Friends and Family: Not on file  . Frequency of Social Gatherings with Friends and Family: Not on  file  . Attends Religious Services: Not on file  . Active Member of Clubs or Organizations: Not on file  . Attends Archivist Meetings: Not on file  . Marital Status: Not on file  Intimate Partner Violence:   . Fear of Current or Ex-Partner: Not on file  . Emotionally Abused: Not on file  . Physically Abused: Not on file  . Sexually Abused: Not on file    Outpatient Medications Prior to Visit  Medication Sig Dispense Refill  . Accu-Chek FastClix Lancets MISC Check blood sugar 4 times daily before meal and at bedtime 102 each 12  . albuterol (VENTOLIN HFA) 108 (90 Base) MCG/ACT inhaler Inhale 2 puffs into the lungs every 6 hours as needed for wheezing or shortness of  breath. 18 g 0  . atomoxetine (STRATTERA) 40 MG capsule Take 40 mg by mouth every morning.    . Blood Glucose Monitoring Suppl (ACCU-CHEK COMPACT CARE KIT) KIT 1 kit by Does not apply route 4 (four) times daily -  before meals and at bedtime. 1 kit 0  . fluticasone (FLOVENT HFA) 110 MCG/ACT inhaler Inhale 1-2 puffs into the lungs daily. 1 Inhaler 12  . hydrOXYzine (VISTARIL) 25 MG capsule Take 25 mg by mouth 3 (three) times daily as needed.    . insulin glargine (LANTUS) 100 UNIT/ML injection Inject 0.3 mLs (30 Units total) into the skin daily. 9 mL 2  . insulin NPH-regular Human (HUMULIN 70/30) (70-30) 100 UNIT/ML injection Inject 30 Units into the skin 3 (three) times daily before meals. 20 mL 11  . Insulin Pen Needle 32G X 4 MM MISC Check blood sugar TID and before meals and at bed time. Administer insulin with needle. 200 each 3  . Insulin Syringe 27G X 1/2" 1 ML MISC Inject 10 Units into the skin 3 (three) times daily before meals. 200 each 3  . lamoTRIgine (LAMICTAL) 100 MG tablet Take 100 mg by mouth 2 (two) times daily.    Marland Kitchen lithium carbonate (ESKALITH) 450 MG CR tablet Take 900 mg by mouth daily.    . metFORMIN (GLUCOPHAGE-XR) 500 MG 24 hr tablet TAKE 2 TABLETS BY MOUTH 2 TIMES DAILY 360 tablet 3  . OLANZapine (ZYPREXA) 5 MG tablet Take 5 mg by mouth at bedtime.    . prazosin (MINIPRESS) 1 MG capsule Take 2 mg by mouth at bedtime.    . propranolol (INDERAL) 20 MG tablet Take 20 mg by mouth 3 (three) times daily.    Marland Kitchen gabapentin (NEURONTIN) 300 MG capsule TAKE 1 CAPSULE BY MOUTH THREE TIMES DAILY 90 capsule 1  . glucose blood (ACCU-CHEK GUIDE) test strip Use as instructed 100 each 12   No facility-administered medications prior to visit.    No Known Allergies  ROS Review of Systems  Constitutional: Negative for chills and fever.  HENT: Negative for congestion and sore throat.   Eyes: Negative for pain and discharge.  Respiratory: Negative for cough and shortness of breath.    Cardiovascular: Negative for chest pain and palpitations.  Gastrointestinal: Negative for constipation, diarrhea, nausea and vomiting.  Endocrine: Negative for polydipsia and polyuria.  Genitourinary: Negative for dysuria and hematuria.  Musculoskeletal: Positive for arthralgias. Negative for neck pain and neck stiffness.       Right shoulder pain  Skin: Negative for rash.  Neurological: Negative for dizziness, weakness, numbness and headaches.  Psychiatric/Behavioral: Negative for agitation and behavioral problems.      Objective:    Physical Exam Vitals reviewed.  Constitutional:      General: He is not in acute distress.    Appearance: He is obese. He is not diaphoretic.  HENT:     Head: Normocephalic and atraumatic.     Nose: Nose normal.     Mouth/Throat:     Mouth: Mucous membranes are moist.  Eyes:     General: No scleral icterus.    Extraocular Movements: Extraocular movements intact.     Pupils: Pupils are equal, round, and reactive to light.  Cardiovascular:     Rate and Rhythm: Normal rate and regular rhythm.     Pulses: Normal pulses.     Heart sounds: Normal heart sounds. No murmur heard.   Pulmonary:     Breath sounds: Normal breath sounds. No wheezing or rales.  Abdominal:     Palpations: Abdomen is soft.     Tenderness: There is no abdominal tenderness.  Musculoskeletal:     Right shoulder: No swelling, effusion or bony tenderness. Decreased range of motion (painful).     Left shoulder: No swelling, effusion or bony tenderness. Normal range of motion.     Cervical back: Neck supple. No tenderness.     Right lower leg: No edema.     Left lower leg: No edema.  Skin:    General: Skin is warm.     Findings: No rash.  Neurological:     General: No focal deficit present.     Mental Status: He is alert and oriented to person, place, and time.     Sensory: No sensory deficit.     Motor: No weakness.  Psychiatric:        Mood and Affect: Mood normal.         Behavior: Behavior normal.      BP 108/75 (BP Location: Left Arm, Patient Position: Sitting)   Pulse 91   Temp 99 F (37.2 C) (Oral)   Resp 18   Ht 5' 8" (1.727 m)   Wt (!) 301 lb 1.9 oz (136.6 kg)   SpO2 95%   BMI 45.79 kg/m  Wt Readings from Last 3 Encounters:  09/17/20 (!) 301 lb 1.9 oz (136.6 kg)  07/30/20 294 lb 6.4 oz (133.5 kg)  11/20/19 274 lb 9.6 oz (124.6 kg)     Health Maintenance Due  Topic Date Due  . Hepatitis C Screening  Never done  . PNEUMOCOCCAL POLYSACCHARIDE VACCINE AGE 66-64 HIGH RISK  Never done  . HIV Screening  Never done  . TETANUS/TDAP  Never done  . FOOT EXAM  09/17/2020    There are no preventive care reminders to display for this patient.  No results found for: TSH Lab Results  Component Value Date   WBC 7.9 09/07/2019   HGB 16.1 09/07/2019   HCT 46.1 09/07/2019   MCV 95.8 09/07/2019   PLT 279 09/07/2019   Lab Results  Component Value Date   NA 130 (L) 09/07/2019   K 4.2 09/07/2019   CO2 23 09/07/2019   GLUCOSE 514 (HH) 09/07/2019   BUN 10 09/07/2019   CREATININE 1.16 09/07/2019   CALCIUM 9.0 09/07/2019   ANIONGAP 12 09/07/2019   Lab Results  Component Value Date   CHOL 165 11/20/2019   Lab Results  Component Value Date   HDL 28 (L) 11/20/2019   Lab Results  Component Value Date   LDLCALC 76 11/20/2019   Lab Results  Component Value Date   TRIG 379 (H) 11/20/2019   Lab Results  Component  Value Date   CHOLHDL 5.9 (H) 11/20/2019   Lab Results  Component Value Date   HGBA1C 9.4 (A) 07/30/2020   HGBA1C 9.4 07/30/2020   HGBA1C 9.4 (A) 07/30/2020   HGBA1C 9.4 (A) 07/30/2020      Assessment & Plan:   Problem List Items Addressed This Visit      Respiratory   Sleep apnea    Has had sleep study in the past Does not have CPAP machine currently Referred to Pulmonology for sleep study and arrangement for CPAP --> will try to follow up with office to get sooner appointment      COPD (chronic obstructive  pulmonary disease) ([place])    Well-controlled On Flovent and Ventolin inhaler PRN        Endocrine   Diabetes mellitus without complication ([place]) - Primary    HbA1C: 9.4 today in office Home blood glucose readings reviewed, around 200 most of the time. On Novolin 70/30, takes 10 U TID and Metformin 500 mg QD Continue Lantus 30 U qHS Added Glipizide Advised to comply with the treatment Advised to follow diabetic diet F/u CMP and lipid panel       Relevant Medications   glipiZIDE (GLUCOTROL) 10 MG tablet     Other   Shoulder pain, right    X-ray of the right shoulder negative for fracture. Ordered MRI of the right shoulder Tylenol PRN      Relevant Orders   MR Shoulder Right Wo Contrast   Bipolar disorder (HCC)    On Lithium and Lamictal Takes Atomoxetine for resistant depression Recently started [employer] up with Psychiatrist      Tobacco abuse    Asked about quitting: confirms that he currently smokes cigarettes Advise to quit smoking: Educated about QUITTING to reduce the risk of cancer, cardio and cerebrovascular disease. Assess willingness: Unwilling to quit or cut down at this time Assist with counseling and pharmacotherapy: Counseled for 5 minutes and literature provided. Arrange for follow up: Follow up and continue to offer help.         Meds ordered this encounter  Medications  . glipiZIDE (GLUCOTROL) 10 MG tablet    Sig: Take 1 tablet (10 mg total) by mouth 2 (two) times daily before a meal.    Dispense:  60 tablet    Refill:  3    Follow-up: Return in about 3 months (around 12/18/2020).    Lindell Spar, MD

## 2020-09-17 NOTE — Patient Instructions (Signed)
Please start taking Glipizide as prescribed. Please take Lantus 30 U at nighttime and premeal 10 U insulin. Please take only 8 U if you notice blood glucose less than 100. Please do not skip any meal after taking insulin.  Please continue to check blood glucose and blood pressure regularly.  You are being scheduled to get MRI of the right shoulder done.  We are trying to contact Pulmonology office for sooner appointment.

## 2020-09-18 LAB — CBC WITH DIFFERENTIAL/PLATELET
Basophils Absolute: 0.1 10*3/uL (ref 0.0–0.2)
Basos: 1 %
EOS (ABSOLUTE): 0.3 10*3/uL (ref 0.0–0.4)
Eos: 3 %
Hematocrit: 46.2 % (ref 37.5–51.0)
Hemoglobin: 16.2 g/dL (ref 13.0–17.7)
Immature Grans (Abs): 0.1 10*3/uL (ref 0.0–0.1)
Immature Granulocytes: 1 %
Lymphocytes Absolute: 3 10*3/uL (ref 0.7–3.1)
Lymphs: 30 %
MCH: 33.3 pg — ABNORMAL HIGH (ref 26.6–33.0)
MCHC: 35.1 g/dL (ref 31.5–35.7)
MCV: 95 fL (ref 79–97)
Monocytes Absolute: 0.8 10*3/uL (ref 0.1–0.9)
Monocytes: 8 %
Neutrophils Absolute: 5.8 10*3/uL (ref 1.4–7.0)
Neutrophils: 57 %
Platelets: 291 10*3/uL (ref 150–450)
RBC: 4.87 x10E6/uL (ref 4.14–5.80)
RDW: 12.8 % (ref 11.6–15.4)
WBC: 10.1 10*3/uL (ref 3.4–10.8)

## 2020-09-18 LAB — CMP14+EGFR
ALT: 20 IU/L (ref 0–44)
AST: 14 IU/L (ref 0–40)
Albumin/Globulin Ratio: 1.7 (ref 1.2–2.2)
Albumin: 4.4 g/dL (ref 4.0–5.0)
Alkaline Phosphatase: 87 IU/L (ref 44–121)
BUN/Creatinine Ratio: 13 (ref 9–20)
BUN: 12 mg/dL (ref 6–20)
Bilirubin Total: 0.4 mg/dL (ref 0.0–1.2)
CO2: 26 mmol/L (ref 20–29)
Calcium: 9.6 mg/dL (ref 8.7–10.2)
Chloride: 102 mmol/L (ref 96–106)
Creatinine, Ser: 0.93 mg/dL (ref 0.76–1.27)
GFR calc Af Amer: 119 mL/min/{1.73_m2} (ref >59)
GFR calc non Af Amer: 103 mL/min/{1.73_m2} (ref >59)
Globulin, Total: 2.6 g/dL (ref 1.5–4.5)
Glucose: 93 mg/dL (ref 65–99)
Potassium: 4.5 mmol/L (ref 3.5–5.2)
Sodium: 141 mmol/L (ref 134–144)
Total Protein: 7 g/dL (ref 6.0–8.5)

## 2020-09-18 LAB — LIPID PANEL
Chol/HDL Ratio: 4.4 ratio (ref 0.0–5.0)
Cholesterol, Total: 151 mg/dL (ref 100–199)
HDL: 34 mg/dL — ABNORMAL LOW (ref >39)
LDL Chol Calc (NIH): 89 mg/dL (ref 0–99)
Triglycerides: 157 mg/dL — ABNORMAL HIGH (ref 0–149)
VLDL Cholesterol Cal: 28 mg/dL (ref 5–40)

## 2020-09-18 LAB — T4 AND TSH
T4, Total: 7.4 ug/dL (ref 4.5–12.0)
TSH: 6.68 u[IU]/mL — ABNORMAL HIGH (ref 0.450–4.500)

## 2020-09-24 LAB — THYROID PEROXIDASE ANTIBODY: Thyroperoxidase Ab SerPl-aCnc: 11 IU/mL (ref 0–34)

## 2020-09-24 LAB — T4, FREE: Free T4: 1.1 ng/dL (ref 0.82–1.77)

## 2020-09-24 LAB — SPECIMEN STATUS REPORT

## 2020-10-21 ENCOUNTER — Other Ambulatory Visit: Payer: Self-pay | Admitting: Internal Medicine

## 2020-10-21 DIAGNOSIS — E119 Type 2 diabetes mellitus without complications: Secondary | ICD-10-CM

## 2020-10-29 ENCOUNTER — Other Ambulatory Visit: Payer: Self-pay | Admitting: Family Medicine

## 2020-10-30 ENCOUNTER — Ambulatory Visit (HOSPITAL_COMMUNITY): Payer: Medicaid Other

## 2020-11-03 ENCOUNTER — Institutional Professional Consult (permissible substitution): Payer: Medicaid Other | Admitting: Pulmonary Disease

## 2020-11-14 ENCOUNTER — Ambulatory Visit (HOSPITAL_COMMUNITY): Payer: Medicaid Other

## 2020-11-18 ENCOUNTER — Ambulatory Visit (HOSPITAL_COMMUNITY)
Admission: RE | Admit: 2020-11-18 | Discharge: 2020-11-18 | Disposition: A | Discharge status: Home / Self Care | Payer: Medicaid Other | Source: Ambulatory Visit | Attending: Internal Medicine | Admitting: Internal Medicine

## 2020-11-18 ENCOUNTER — Other Ambulatory Visit: Payer: Self-pay

## 2020-11-18 DIAGNOSIS — M25511 Pain in right shoulder: Secondary | ICD-10-CM | POA: Diagnosis present

## 2020-11-18 DIAGNOSIS — G8929 Other chronic pain: Secondary | ICD-10-CM | POA: Diagnosis present

## 2020-11-19 ENCOUNTER — Other Ambulatory Visit: Payer: Self-pay | Admitting: Internal Medicine

## 2020-11-19 DIAGNOSIS — M67813 Other specified disorders of tendon, right shoulder: Secondary | ICD-10-CM

## 2020-11-25 ENCOUNTER — Other Ambulatory Visit: Payer: Self-pay

## 2020-11-25 ENCOUNTER — Ambulatory Visit (INDEPENDENT_AMBULATORY_CARE_PROVIDER_SITE_OTHER): Payer: Medicaid Other | Admitting: Orthopedic Surgery

## 2020-11-25 ENCOUNTER — Encounter: Payer: Self-pay | Admitting: Orthopedic Surgery

## 2020-11-25 VITALS — BP 139/79 | HR 79 | Ht 68.0 in | Wt 304.0 lb

## 2020-11-25 DIAGNOSIS — M7581 Other shoulder lesions, right shoulder: Secondary | ICD-10-CM

## 2020-11-25 DIAGNOSIS — Z6841 Body Mass Index (BMI) 40.0 and over, adult: Secondary | ICD-10-CM

## 2020-11-25 NOTE — Progress Notes (Signed)
New Patient Visit  Assessment: Isaac Weiss is a 40 y.o. RHD male with the following: Right rotator cuff tendonitis; component of adhesive capsulitis  Plan: He has had right shoulder pain for greater than a year.  He denies a specific injury.  MRI demonstrates supraspinatus tendinopathy and he has restricted and painful ROM.  His rotator cuff is clearly irritated, and he likely has some adhesive capsulitis.  He can continue with ibuprofen as needed.  I provided him with home shoulder exercises.  We also discussed a steroid injection, and he elected to proceed.  The goal is to improve his symptoms and allow him to gradually increased his ROM and strength.  Follow up as needed.     Body mass index is 46.22 kg/m.   Procedure note injection - Right Shoulder joint   Verbal consent was obtained to inject the Right Shoulder joint  Timeout was completed to confirm the site of injection.  The skin was prepped with alcohol and ethyl chloride was sprayed at the injection site.  A 21-gauge needle was used to inject 40 mg of Depo-Medrol and 1% lidocaine (3 cc) into the Right Shoulder (subacromial space) using an Posterolateral approach.  There were no complications. A sterile bandage was applied.    Follow-up: Return if symptoms worsen or fail to improve.  Subjective:  Chief Complaint  Patient presents with  . Shoulder Pain    Pt reports right side x 1 year,    History of Present Illness: Isaac Weiss is a 40 y.o. RHD male who has been referred by Ihor Dow, MD for evaluation of right shoulder pain.  It started about a year ago.  He denies a specific injury.  He has both pain and restricted ROM.  He takes ibuprofen for pain.  He has not done PT, but notes his shoulder is more painful if he attempts to use it more often.  He has not had an injection.  Pain is primarily located in the superior and anterior aspect of his shoulder.    Review of Systems: No fevers or chills No  numbness or tingling No chest pain No shortness of breath No bowel or bladder dysfunction No GI distress No headaches   Medical History:  Past Medical History:  Diagnosis Date  . Anxiety    Phreesia 07/27/2020  . Arthritis    Phreesia 07/27/2020  . Asthma   . COPD (chronic obstructive pulmonary disease) ([place])   . Depression    Phreesia 07/27/2020  . Diabetes mellitus without complication ([place])   . Hyperlipidemia    Phreesia 07/27/2020  . Neuromuscular disorder ([place])    Phreesia 07/27/2020  . Sleep apnea    Phreesia 07/27/2020    History reviewed. No pertinent surgical history.  History reviewed. No pertinent family history. Social History   Tobacco Use  . Smoking status: Current Every Day Smoker    Packs/day: 1.00    Types: Cigarettes  . Smokeless tobacco: Never Used  Substance Use Topics  . Alcohol use: Yes  . Drug use: Never    No Known Allergies  Current Meds  Medication Sig  . Accu-Chek FastClix Lancets MISC Check blood sugar 4 times daily before meal and at bedtime  . albuterol (VENTOLIN HFA) 108 (90 Base) MCG/ACT inhaler Inhale 2 puffs into the lungs every 6 hours as needed for wheezing or shortness of breath.  Marland Kitchen atomoxetine (STRATTERA) 40 MG capsule Take 40 mg by mouth every morning.  . B-D INS SYR MICROFINE 1CC/28G 28G  X 1/2" 1 ML MISC USE WITH INSULIN THREE TIMES DAILY BEFORE MEALS  . Blood Glucose Monitoring Suppl (ACCU-CHEK COMPACT CARE KIT) KIT 1 kit by Does not apply route 4 (four) times daily -  before meals and at bedtime.  . fluticasone (FLOVENT HFA) 110 MCG/ACT inhaler Inhale 1-2 puffs into the lungs daily.  Marland Kitchen glipiZIDE (GLUCOTROL) 10 MG tablet Take 1 tablet (10 mg total) by mouth 2 (two) times daily before a meal.  . hydrOXYzine (VISTARIL) 25 MG capsule Take 25 mg by mouth 3 (three) times daily as needed.  . insulin NPH-regular Human (HUMULIN 70/30) (70-30) 100 UNIT/ML injection Inject 30 Units into the skin 3 (three) times daily before meals.   . Insulin Syringe 27G X 1/2" 1 ML MISC Inject 10 Units into the skin 3 (three) times daily before meals.  . lamoTRIgine (LAMICTAL) 100 MG tablet Take 100 mg by mouth 2 (two) times daily.  Marland Kitchen LANTUS 100 UNIT/ML injection INJECT 30 UNITS into THE SKIN DAILY  . lithium carbonate (ESKALITH) 450 MG CR tablet Take 900 mg by mouth daily.  . metFORMIN (GLUCOPHAGE-XR) 500 MG 24 hr tablet TAKE 2 TABLETS BY MOUTH 2 TIMES DAILY  . OLANZapine (ZYPREXA) 5 MG tablet Take 5 mg by mouth at bedtime.  . prazosin (MINIPRESS) 1 MG capsule Take 2 mg by mouth at bedtime.  . propranolol (INDERAL) 20 MG tablet Take 20 mg by mouth 3 (three) times daily.    Objective: BP 139/79   Pulse 79   Ht 5' 8"  (1.727 m)   Wt (!) 304 lb (137.9 kg)   BMI 46.22 kg/m   Physical Exam:  General:  Alert and oriented, no acute distress.  Obese male Gait: Normal  Evaluation of the right shoulder demonstrates no deformity.  No atrophy is appreciated.  Decreased passive external rotation at his side.  Pain with empty can testing.  Negative belly press. Internal rotation to his back pocket.     IMAGING: I personally reviewed images previously obtained in clinic  Normal appearing right shoulder XR.  MRI shows irritation of the supraspinatus.  No tears are appreciated.    New Medications:  No orders of the defined types were placed in this encounter.     Mordecai Rasmussen, MD  11/25/2020 9:38 AM

## 2020-11-25 NOTE — Patient Instructions (Signed)

## 2020-11-28 ENCOUNTER — Other Ambulatory Visit: Payer: Self-pay | Admitting: Family Medicine

## 2020-11-28 DIAGNOSIS — E119 Type 2 diabetes mellitus without complications: Secondary | ICD-10-CM

## 2020-12-04 ENCOUNTER — Ambulatory Visit (HOSPITAL_COMMUNITY)
Admission: RE | Admit: 2020-12-04 | Discharge: 2020-12-04 | Disposition: A | Discharge status: Home / Self Care | Payer: Medicaid Other | Source: Ambulatory Visit | Attending: Pulmonary Disease | Admitting: Pulmonary Disease

## 2020-12-04 ENCOUNTER — Encounter: Payer: Self-pay | Admitting: Pulmonary Disease

## 2020-12-04 ENCOUNTER — Ambulatory Visit (INDEPENDENT_AMBULATORY_CARE_PROVIDER_SITE_OTHER): Payer: Medicaid Other | Admitting: Pulmonary Disease

## 2020-12-04 ENCOUNTER — Other Ambulatory Visit: Payer: Self-pay

## 2020-12-04 ENCOUNTER — Encounter (INDEPENDENT_AMBULATORY_CARE_PROVIDER_SITE_OTHER): Payer: Self-pay

## 2020-12-04 VITALS — BP 128/88 | HR 74 | Temp 97.6°F | Ht 68.0 in | Wt 307.4 lb

## 2020-12-04 DIAGNOSIS — R0683 Snoring: Secondary | ICD-10-CM | POA: Diagnosis not present

## 2020-12-04 DIAGNOSIS — J411 Mucopurulent chronic bronchitis: Secondary | ICD-10-CM | POA: Diagnosis not present

## 2020-12-04 DIAGNOSIS — Z720 Tobacco use: Secondary | ICD-10-CM

## 2020-12-04 DIAGNOSIS — Z6841 Body Mass Index (BMI) 40.0 and over, adult: Secondary | ICD-10-CM

## 2020-12-04 MED ORDER — ALBUTEROL SULFATE HFA 108 (90 BASE) MCG/ACT IN AERS: 2.0000 | INHALATION_SPRAY | Freq: Four times a day (QID) | RESPIRATORY_TRACT | 5 refills | Status: DC | PRN

## 2020-12-04 MED ORDER — FLOVENT HFA 110 MCG/ACT IN AERO: 2.0000 | INHALATION_SPRAY | Freq: Two times a day (BID) | RESPIRATORY_TRACT | 5 refills | Status: DC

## 2020-12-04 NOTE — Patient Instructions (Signed)
Flovent two puffs in the morning and two puffs in the evening  Albuterol two puffs every 6 hours as needed for cough, wheeze, chest congestion, or shortness of breath  Will arrange for chest xray, pulmonary function test, and in lab sleep study  Will call to schedule follow up appointment after tests are completed

## 2020-12-04 NOTE — Progress Notes (Signed)
[facility]  Chief Complaint  Patient presents with  . Consult    Sleep consult-used to be on CPAP 7-8 years ago    Constitutional:  BP 128/88 (BP Location: Left Arm, Cuff Size: Normal)   Pulse 74   Temp 97.6 F (36.4 C) (Other (Comment)) Comment (Src): Wrist  Ht 5' 8" (1.727 m)   Wt (!) 307 lb 6.4 oz (139.4 kg)   SpO2 96% Comment: Room air  BMI 46.74 kg/m   Past Medical History:  Anxiety, OA, Depression, DM type 2, HLD  Past Surgical History:  He  has no past surgical history on file.  Brief Summary:  Isaac Weiss is a 40 y.o. male smoker with obstructive sleep apnea and asthma.      Subjective:   He is originally from [place].  He had a sleep study there several years ago and was found to have sleep apnea.  He hasn't used CPAP in years.  His wife has been worried about his sleep.  He snores and stops breathing.  He goes to sleep at 10 pm.  He falls asleep about 10 minutes.  He wakes up some times to use the bathroom.  He gets out of bed at 6 am.  He feels tired in the morning.  He is prone to getting headaches.  He does not use anything to help him fall sleep or stay awake.  He denies sleep walking, sleep talking, bruxism, or nightmares.  There is no history of restless legs.  He denies sleep hallucinations, sleep paralysis, or cataplexy.  The Epworth score is 15 out of 24.  He smokes a pack of cigarettes per day.  He tried a medicine and nicotine replacement several years ago, but didn't work.  He isn't sure he is ready to quit at this time.  He has cough with clear to yellow sputum and intermittent wheezing.  No recent chest xray or PFT.  He has been using his aunt's flovent and this helps some, but he has only been using once per day.    No history of pneumonia or TB.  He thinks his aunt and mother have sleep apnea.  He is not currently working.  Physical Exam:   Appearance - well kempt   ENMT - no sinus  tenderness, no oral exudate, no LAN, Mallampati 3 airway, no stridor, 2+ tonsils, elongated uvula  Respiratory - equal breath sounds bilaterally, no wheezing or rales  CV - s1s2 regular rate and rhythm, no murmurs  Ext - no clubbing, no edema  Skin - no rashes  Psych - normal mood and affect   Pulmonary testing:    Sleep Tests:    Social History:  He  reports that he has been smoking cigarettes. He has been smoking about 1.00 pack per day. He has never used smokeless tobacco. He reports current alcohol use. He reports that he does not use drugs.  Family History:  His family history is not on file.    Discussion:  He has snoring, sleep disruption, witnessed apnea, and daytime sleepiness.  He has a history of depression and diabetes.  His BMI is > 35.  I am concerned he still has obstructive sleep apnea.  He has tobacco abuse.  He has chronic productive cough associated with intermittent wheezing.  Assessment/Plan:   Snoring with excessive daytime sleepiness. - will need to arrange for a in lab sleep study since [facility] medicaid doesn't cover home sleep studies  Chronic bronchitis. - will have him use flovent bid and prn albuterol - will arrange for chest xray and pulmonary function test  Tobacco abuse. - reviewed options to assist with smoking cessation - he will think over these options  Obesity. - discussed how weight can impact sleep and risk for sleep disordered breathing - discussed options to assist with weight loss: combination of diet modification, cardiovascular and strength training exercises  Cardiovascular risk. - had an extensive discussion regarding the adverse health consequences related to untreated sleep disordered breathing - specifically discussed the risks for hypertension, coronary artery disease, cardiac dysrhythmias, cerebrovascular disease, and diabetes - lifestyle modification discussed  Safe driving practices. - discussed how sleep disruption  can increase risk of accidents, particularly when driving - safe driving practices were discussed  Therapies for obstructive sleep apnea. - if the sleep study shows significant sleep apnea, then various therapies for treatment were reviewed: CPAP, oral appliance, and surgical interventions   Time Spent Involved in Patient Care on Day of Examination:  32 minutes  Follow up:  Patient Instructions  Flovent two puffs in the morning and two puffs in the evening  Albuterol two puffs every 6 hours as needed for cough, wheeze, chest congestion, or shortness of breath  Will arrange for chest xray, pulmonary function test, and in lab sleep study  Will call to schedule follow up appointment after tests are completed   Medication List:   Allergies as of 12/04/2020   No Known Allergies     Medication List       Accurate as of December 04, 2020 10:50 AM. If you have any questions, ask your nurse or doctor.        STOP taking these medications   hydrOXYzine 25 MG capsule Commonly known as: VISTARIL Stopped by: Chesley Mires, MD     TAKE these medications   ACCU-CHEK COMPACT CARE KIT Kit 1 kit by Does not apply route 4 (four) times daily -  before meals and at bedtime.   Accu-Chek FastClix Lancets Misc USE TO CHECK BLOOD SUGAR FOUR TIMES DAILY BEFORE MEALS AND AT BEDTIME   albuterol 108 (90 Base) MCG/ACT inhaler Commonly known as: VENTOLIN HFA Inhale 2 puffs into the lungs every 6 (six) hours as needed for wheezing or shortness of breath. What changed: See the new instructions. Changed by: Chesley Mires, MD   ARIPiprazole 5 MG tablet Commonly known as: ABILIFY Take 5 mg by mouth daily.   atomoxetine 40 MG capsule Commonly known as: STRATTERA Take 40 mg by mouth every morning.   Flovent HFA 110 MCG/ACT inhaler Generic drug: fluticasone Inhale 2 puffs into the lungs 2 (two) times daily. What changed:   how much to take  when to take this Changed by: Chesley Mires, MD    glipiZIDE 10 MG tablet Commonly known as: GLUCOTROL Take 1 tablet (10 mg total) by mouth 2 (two) times daily before a meal.   HumuLIN 70/30 (70-30) 100 UNIT/ML injection Generic drug: insulin NPH-regular Human Inject 30 Units into the skin 3 (three) times daily before meals.   Insulin Syringe 27G X 1/2" 1 ML Misc Inject 10 Units into the skin 3 (three) times daily before meals.   B-D INS SYR MICROFINE 1CC/28G 28G X 1/2" 1 ML Misc Generic drug: INS SYRINGE/NEEDLE 1CC/28G USE WITH INSULIN THREE TIMES DAILY BEFORE MEALS   lamoTRIgine 100 MG tablet Commonly known as: LAMICTAL Take 100 mg by mouth 2 (two) times daily.   Lantus 100 UNIT/ML injection  Generic drug: insulin glargine INJECT 30 UNITS into THE SKIN DAILY   lithium carbonate 450 MG CR tablet Commonly known as: ESKALITH Take 900 mg by mouth daily.   metFORMIN 500 MG 24 hr tablet Commonly known as: GLUCOPHAGE-XR TAKE 2 TABLETS BY MOUTH 2 TIMES DAILY   OLANZapine 5 MG tablet Commonly known as: ZYPREXA Take 5 mg by mouth at bedtime.   prazosin 1 MG capsule Commonly known as: MINIPRESS Take 2 mg by mouth at bedtime.   propranolol 20 MG tablet Commonly known as: INDERAL Take 20 mg by mouth 3 (three) times daily.       Signature:  Chesley Mires, MD [place] Pager - [phone] 12/04/2020, 10:50 AM

## 2020-12-10 ENCOUNTER — Other Ambulatory Visit: Payer: Self-pay

## 2020-12-10 ENCOUNTER — Encounter (HOSPITAL_BASED_OUTPATIENT_CLINIC_OR_DEPARTMENT_OTHER): Payer: Medicaid Other | Admitting: Pulmonary Disease

## 2020-12-10 ENCOUNTER — Ambulatory Visit: Payer: Medicaid Other | Attending: Pulmonary Disease | Admitting: Pulmonary Disease

## 2020-12-10 DIAGNOSIS — R0683 Snoring: Secondary | ICD-10-CM

## 2020-12-10 DIAGNOSIS — G4733 Obstructive sleep apnea (adult) (pediatric): Secondary | ICD-10-CM | POA: Insufficient documentation

## 2020-12-11 ENCOUNTER — Other Ambulatory Visit: Payer: Self-pay | Admitting: Internal Medicine

## 2020-12-15 ENCOUNTER — Other Ambulatory Visit (HOSPITAL_COMMUNITY)
Admission: RE | Admit: 2020-12-15 | Discharge: 2020-12-15 | Disposition: A | Discharge status: Home / Self Care | Payer: Medicaid Other | Source: Ambulatory Visit | Attending: Pulmonary Disease | Admitting: Pulmonary Disease

## 2020-12-15 ENCOUNTER — Other Ambulatory Visit: Payer: Self-pay

## 2020-12-15 DIAGNOSIS — Z20822 Contact with and (suspected) exposure to covid-19: Secondary | ICD-10-CM | POA: Insufficient documentation

## 2020-12-15 DIAGNOSIS — Z01812 Encounter for preprocedural laboratory examination: Secondary | ICD-10-CM | POA: Diagnosis not present

## 2020-12-15 LAB — SARS CORONAVIRUS 2 (TAT 6-24 HRS): SARS Coronavirus 2: NEGATIVE

## 2020-12-16 ENCOUNTER — Other Ambulatory Visit: Payer: Self-pay

## 2020-12-16 ENCOUNTER — Ambulatory Visit (HOSPITAL_COMMUNITY)
Admission: RE | Admit: 2020-12-16 | Discharge: 2020-12-16 | Disposition: A | Discharge status: Home / Self Care | Payer: Medicaid Other | Source: Ambulatory Visit | Attending: Pulmonary Disease | Admitting: Pulmonary Disease

## 2020-12-16 DIAGNOSIS — J411 Mucopurulent chronic bronchitis: Secondary | ICD-10-CM | POA: Diagnosis present

## 2020-12-16 LAB — PULMONARY FUNCTION TEST
DL/VA % pred: 99 %
DL/VA: 4.71 ml/min/mmHg/L
DLCO unc % pred: 90 %
DLCO unc: 26.53 ml/min/mmHg
FEF 25-75 Post: 3.1 L/sec
FEF 25-75 Pre: 2.49 L/sec
FEF2575-%Change-Post: 24 %
FEF2575-%Pred-Post: 80 %
FEF2575-%Pred-Pre: 64 %
FEV1-%Change-Post: 6 %
FEV1-%Pred-Post: 79 %
FEV1-%Pred-Pre: 75 %
FEV1-Post: 3.19 L
FEV1-Pre: 3 L
FEV1FVC-%Change-Post: 0 %
FEV1FVC-%Pred-Pre: 97 %
FEV6-%Change-Post: 6 %
FEV6-%Pred-Post: 84 %
FEV6-%Pred-Pre: 79 %
FEV6-Post: 4.13 L
FEV6-Pre: 3.86 L
FEV6FVC-%Change-Post: 0 %
FEV6FVC-%Pred-Post: 102 %
FEV6FVC-%Pred-Pre: 102 %
FVC-%Change-Post: 7 %
FVC-%Pred-Post: 83 %
FVC-%Pred-Pre: 77 %
FVC-Post: 4.14 L
FVC-Pre: 3.86 L
Post FEV1/FVC ratio: 77 %
Post FEV6/FVC ratio: 100 %
Pre FEV1/FVC ratio: 78 %
Pre FEV6/FVC Ratio: 100 %
RV % pred: 115 %
RV: 1.95 L
TLC % pred: 94 %
TLC: 6.16 L

## 2020-12-16 MED ORDER — ALBUTEROL SULFATE (2.5 MG/3ML) 0.083% IN NEBU
2.5000 mg | INHALATION_SOLUTION | Freq: Once | RESPIRATORY_TRACT | Status: AC
  Administered 2020-12-16: 2.5 mg via RESPIRATORY_TRACT

## 2020-12-17 NOTE — Progress Notes (Signed)
Called and went over xray results per Dr Sood with patient. All questions answered and patient expressed full understanding. Nothing further needed at this time.

## 2020-12-18 ENCOUNTER — Other Ambulatory Visit: Payer: Self-pay

## 2020-12-18 ENCOUNTER — Encounter: Payer: Self-pay | Admitting: Internal Medicine

## 2020-12-18 ENCOUNTER — Ambulatory Visit (INDEPENDENT_AMBULATORY_CARE_PROVIDER_SITE_OTHER): Payer: Medicaid Other | Admitting: Internal Medicine

## 2020-12-18 ENCOUNTER — Telehealth: Payer: Self-pay | Admitting: Pulmonary Disease

## 2020-12-18 VITALS — BP 141/84 | HR 77 | Resp 18 | Ht 68.0 in | Wt 307.4 lb

## 2020-12-18 DIAGNOSIS — E119 Type 2 diabetes mellitus without complications: Secondary | ICD-10-CM | POA: Diagnosis not present

## 2020-12-18 DIAGNOSIS — G4730 Sleep apnea, unspecified: Secondary | ICD-10-CM

## 2020-12-18 DIAGNOSIS — Z8619 Personal history of other infectious and parasitic diseases: Secondary | ICD-10-CM

## 2020-12-18 DIAGNOSIS — M67813 Other specified disorders of tendon, right shoulder: Secondary | ICD-10-CM

## 2020-12-18 DIAGNOSIS — J411 Mucopurulent chronic bronchitis: Secondary | ICD-10-CM

## 2020-12-18 DIAGNOSIS — N529 Male erectile dysfunction, unspecified: Secondary | ICD-10-CM | POA: Insufficient documentation

## 2020-12-18 DIAGNOSIS — F3160 Bipolar disorder, current episode mixed, unspecified: Secondary | ICD-10-CM

## 2020-12-18 MED ORDER — OZEMPIC (0.25 OR 0.5 MG/DOSE) 2 MG/1.5ML ~~LOC~~ SOPN: 0.2500 mg | PEN_INJECTOR | SUBCUTANEOUS | 0 refills | Status: DC

## 2020-12-18 MED ORDER — LIDOCAINE-PRILOCAINE 2.5-2.5 % EX CREA: 1.0000 "application " | TOPICAL_CREAM | CUTANEOUS | 0 refills | Status: DC | PRN

## 2020-12-18 MED ORDER — SILDENAFIL CITRATE 50 MG PO TABS: 50.0000 mg | ORAL_TABLET | Freq: Every day | ORAL | 0 refills | Status: DC | PRN

## 2020-12-18 NOTE — Progress Notes (Signed)
Established Patient Office Visit  Subjective:  Patient ID: Isaac Weiss, male    DOB: [birth year]  Age: 40 y.o. MRN: [id number]  CC:  Chief Complaint  Patient presents with  . Follow-up    3 month follow up very shaky has severly cracked lips and would like to discuss erectile dysfunction     HPI Isaac Weiss isa 40 year old malewith past medical history of uncontrolled diabetes mellitus, COPD, sleep apnea, bipolar disorder disorder, anxiety, PTSD, tobacco abuse and morbid obesity who presents for follow up of his chronic medical conditions.  He has been taking Lantus 30 U qHS and Humulin 70/30 - 30 U TID. His blood glucose ranges around 200s. He also takes Metformin and Glipizide. He denies any polyuria or polyphagia.  He had sleep study done, but has not received CPAP device yet.  He c/o cold sores around the lips, and has tried multiple OTC products for it.  He also c/o erectile dysfunction. Denies having morning erections. He follows up with Psychiatrist for bipolar disorder.  Past Medical History:  Diagnosis Date  . Anxiety    Phreesia 07/27/2020  . Arthritis    Phreesia 07/27/2020  . Asthma   . COPD (chronic obstructive pulmonary disease) ([place])   . Depression    Phreesia 07/27/2020  . Diabetes mellitus without complication ([place])   . Hyperlipidemia    Phreesia 07/27/2020  . Neuromuscular disorder ([place])    Phreesia 07/27/2020  . Sleep apnea    Phreesia 07/27/2020    History reviewed. No pertinent surgical history.  History reviewed. No pertinent family history.  Social History   Socioeconomic History  . Marital status: Married    Spouse name: Not on file  . Number of children: Not on file  . Years of education: Not on file  . Highest education level: Not on file  Occupational History  . Not on file  Tobacco Use  . Smoking status: Current Every Day Smoker    Packs/day: 1.00    Types: Cigarettes  . Smokeless tobacco: Never Used  . Tobacco  comment: smoke 1/2 packs per day 2/3/222  Vaping Use  . Vaping Use: Never used  Substance and Sexual Activity  . Alcohol use: Yes  . Drug use: Never  . Sexual activity: Not on file  Other Topics Concern  . Not on file  Social History Narrative  . Not on file   Social Determinants of Health   Financial Resource Strain: Not on file  Food Insecurity: Not on file  Transportation Needs: Not on file  Physical Activity: Not on file  Stress: Not on file  Social Connections: Not on file  Intimate Partner Violence: Not on file    Outpatient Medications Prior to Visit  Medication Sig Dispense Refill  . Accu-Chek FastClix Lancets MISC USE TO CHECK BLOOD SUGAR FOUR TIMES DAILY BEFORE MEALS AND AT BEDTIME 102 each 10  . ACCU-CHEK GUIDE test strip USE TO TEST BLOOD SUGAR THREE TIMES DAILY 100 strip 10  . albuterol (VENTOLIN HFA) 108 (90 Base) MCG/ACT inhaler Inhale 2 puffs into the lungs every 6 (six) hours as needed for wheezing or shortness of breath. 6.7 g 5  . ARIPiprazole (ABILIFY) 5 MG tablet Take 5 mg by mouth daily.    Marland Kitchen atomoxetine (STRATTERA) 40 MG capsule Take 40 mg by mouth every morning.    . B-D INS SYR MICROFINE 1CC/28G 28G X 1/2" 1 ML MISC USE WITH INSULIN THREE TIMES DAILY BEFORE MEALS 100 each 0  .  Blood Glucose Monitoring Suppl (ACCU-CHEK COMPACT CARE KIT) KIT 1 kit by Does not apply route 4 (four) times daily -  before meals and at bedtime. 1 kit 0  . fluticasone (FLOVENT HFA) 110 MCG/ACT inhaler Inhale 2 puffs into the lungs 2 (two) times daily. 1 each 5  . glipiZIDE (GLUCOTROL) 10 MG tablet Take 1 tablet (10 mg total) by mouth 2 (two) times daily before a meal. 60 tablet 3  . insulin NPH-regular Human (HUMULIN 70/30) (70-30) 100 UNIT/ML injection Inject 30 Units into the skin 3 (three) times daily before meals. 20 mL 11  . Insulin Syringe 27G X 1/2" 1 ML MISC Inject 10 Units into the skin 3 (three) times daily before meals. 200 each 3  . lamoTRIgine (LAMICTAL) 100 MG tablet  Take 100 mg by mouth 2 (two) times daily.    Marland Kitchen LANTUS 100 UNIT/ML injection INJECT 30 UNITS into THE SKIN DAILY 10 mL 2  . lithium carbonate (ESKALITH) 450 MG CR tablet Take 900 mg by mouth daily.    . metFORMIN (GLUCOPHAGE) 500 MG tablet Take 1 tablet by mouth 2 (two) times daily with a meal.    . prazosin (MINIPRESS) 1 MG capsule Take 2 mg by mouth at bedtime.    . propranolol (INDERAL) 20 MG tablet Take 20 mg by mouth 3 (three) times daily.    . metFORMIN (GLUCOPHAGE-XR) 500 MG 24 hr tablet TAKE 2 TABLETS BY MOUTH 2 TIMES DAILY 360 tablet 3  . OLANZapine (ZYPREXA) 5 MG tablet Take 5 mg by mouth at bedtime. (Patient not taking: No sig reported)     No facility-administered medications prior to visit.    No Known Allergies  ROS Review of Systems  Constitutional: Negative for chills and fever.  HENT: Negative for congestion and sore throat.   Eyes: Negative for pain and discharge.  Respiratory: Negative for cough and shortness of breath.   Cardiovascular: Negative for chest pain and palpitations.  Gastrointestinal: Negative for constipation, diarrhea, nausea and vomiting.  Endocrine: Negative for polydipsia and polyuria.  Genitourinary: Negative for dysuria and hematuria.  Musculoskeletal: Positive for arthralgias. Negative for neck pain and neck stiffness.       Right shoulder pain - better now  Skin: Negative for wound.  Neurological: Negative for dizziness, weakness, numbness and headaches.  Psychiatric/Behavioral: Negative for agitation and behavioral problems.      Objective:    Physical Exam Vitals reviewed.  Constitutional:      General: He is not in acute distress.    Appearance: He is obese. He is not diaphoretic.  HENT:     Head: Normocephalic and atraumatic.     Nose: Nose normal.     Mouth/Throat:     Mouth: Mucous membranes are moist.  Eyes:     General: No scleral icterus.    Extraocular Movements: Extraocular movements intact.     Pupils: Pupils are  equal, round, and reactive to light.  Cardiovascular:     Rate and Rhythm: Normal rate and regular rhythm.     Pulses: Normal pulses.     Heart sounds: Normal heart sounds. No murmur heard.   Pulmonary:     Breath sounds: Normal breath sounds. No wheezing or rales.  Abdominal:     Palpations: Abdomen is soft.     Tenderness: There is no abdominal tenderness.  Musculoskeletal:     Right shoulder: No swelling, effusion or bony tenderness. Normal range of motion.     Left shoulder: No  swelling, effusion or bony tenderness. Normal range of motion.     Cervical back: Neck supple. No tenderness.     Right lower leg: No edema.     Left lower leg: No edema.  Skin:    General: Skin is warm.     Comments: Sores around b/l lips  Neurological:     General: No focal deficit present.     Mental Status: He is alert and oriented to person, place, and time.     Sensory: No sensory deficit.     Motor: No weakness.  Psychiatric:        Mood and Affect: Mood normal.        Behavior: Behavior normal.     BP (!) 141/84 (BP Location: Left Arm, Patient Position: Sitting, Cuff Size: Normal)   Pulse 77   Resp 18   Ht 5' 8" (1.727 m)   Wt (!) 307 lb 6.4 oz (139.4 kg)   SpO2 95%   BMI 46.74 kg/m  Wt Readings from Last 3 Encounters:  12/18/20 (!) 307 lb 6.4 oz (139.4 kg)  12/04/20 (!) 307 lb 6.4 oz (139.4 kg)  11/25/20 (!) 304 lb (137.9 kg)     Health Maintenance Due  Topic Date Due  . Hepatitis C Screening  Never done  . PNEUMOCOCCAL POLYSACCHARIDE VACCINE AGE 57-64 HIGH RISK  Never done  . OPHTHALMOLOGY EXAM  Never done  . HIV Screening  Never done  . TETANUS/TDAP  Never done  . COVID-19 Vaccine (3 - Booster for Pfizer series) 08/26/2020  . FOOT EXAM  09/17/2020    There are no preventive care reminders to display for this patient.  Lab Results  Component Value Date   TSH 6.680 (H) 09/17/2020   Lab Results  Component Value Date   WBC 10.1 09/17/2020   HGB 16.2 09/17/2020   HCT  46.2 09/17/2020   MCV 95 09/17/2020   PLT 291 09/17/2020   Lab Results  Component Value Date   NA 141 09/17/2020   K 4.5 09/17/2020   CO2 26 09/17/2020   GLUCOSE 93 09/17/2020   BUN 12 09/17/2020   CREATININE 0.93 09/17/2020   BILITOT 0.4 09/17/2020   ALKPHOS 87 09/17/2020   AST 14 09/17/2020   ALT 20 09/17/2020   PROT 7.0 09/17/2020   ALBUMIN 4.4 09/17/2020   CALCIUM 9.6 09/17/2020   ANIONGAP 12 09/07/2019   Lab Results  Component Value Date   CHOL 151 09/17/2020   Lab Results  Component Value Date   HDL 34 (L) 09/17/2020   Lab Results  Component Value Date   LDLCALC 89 09/17/2020   Lab Results  Component Value Date   TRIG 157 (H) 09/17/2020   Lab Results  Component Value Date   CHOLHDL 4.4 09/17/2020   Lab Results  Component Value Date   HGBA1C 9.4 (A) 07/30/2020   HGBA1C 9.4 07/30/2020   HGBA1C 9.4 (A) 07/30/2020   HGBA1C 9.4 (A) 07/30/2020      Assessment & Plan:   Problem List Items Addressed This Visit      Respiratory   Sleep apnea    Had sleep study recently Has not received CPAP device yet F/u Pulmonology      COPD (chronic obstructive pulmonary disease) ([place])    Well-controlled On Flovent and Ventolin inhaler PRN        Endocrine   Diabetes mellitus without complication (HCC)    [id number]: 9.4 today in office Home blood glucose readings reviewed, around  200 most of the time. On Novolin 70/30, takes 30 U TID and Metformin 500 mg BID Continue Lantus 30 U qHS Continue Glipizide Blood glucose more than 200s at home Added Ozempic 0.25 mg, plan to increase as tolerated Advised to comply with the treatment Advised to follow diabetic diet F/u CMP and lipid panel       Relevant Medications   metFORMIN (GLUCOPHAGE) 500 MG tablet   Semaglutide,0.25 or 0.5MG/DOS, (OZEMPIC, 0.25 OR 0.5 MG/DOSE,) 2 MG/1.5ML SOPN     Musculoskeletal and Integument   Tendinosis of right shoulder    S/p steroid injection in the right shoulder Had  Orthopedic surgeon evaluation        Other   Bipolar disorder ([place]) - Primary    On Lithium, Abilify and Lamictal Takes Atomoxetine for resistant depression Follows up with Psychiatrist      H/O cold sores   Relevant Medications   lidocaine-prilocaine (EMLA) cream    Other Visit Diagnoses    Erectile dysfunction, unspecified erectile dysfunction type       Relevant Medications   sildenafil (VIAGRA) 50 MG tablet      Meds ordered this encounter  Medications  . lidocaine-prilocaine (EMLA) cream    Sig: Apply 1 application topically as needed.    Dispense:  30 g    Refill:  0  . Semaglutide,0.25 or 0.5MG/DOS, (OZEMPIC, 0.25 OR 0.5 MG/DOSE,) 2 MG/1.5ML SOPN    Sig: Inject 0.25 mg into the skin once a week.    Dispense:  1.5 mL    Refill:  0  . sildenafil (VIAGRA) 50 MG tablet    Sig: Take 1 tablet (50 mg total) by mouth daily as needed for erectile dysfunction.    Dispense:  10 tablet    Refill:  0    Follow-up: Return in about 6 weeks (around 01/29/2021).    Lindell Spar, MD

## 2020-12-18 NOTE — Telephone Encounter (Signed)
PSG 12/10/20 >> AHI 15.9, SpO2 low 83%.  REM AHI 60.   Please inform him that his sleep study shows moderate obstructive sleep apnea.  Please arrange for ROV with me or NP to discuss treatment options.

## 2020-12-18 NOTE — Assessment & Plan Note (Signed)
S/p steroid injection in the right shoulder Had Orthopedic surgeon evaluation

## 2020-12-18 NOTE — Assessment & Plan Note (Signed)
Well-controlled °On Flovent and Ventolin inhaler PRN °

## 2020-12-18 NOTE — Procedures (Signed)
    Patient Name: Isaac Weiss, Isaac Weiss Date: 12/10/2020 Gender: Male D.O.B: 09-18-1981 Age (years): 39 Referring Provider: Coralyn Helling MD, ABSM Height (inches): 68 Interpreting Physician: Coralyn Helling MD, ABSM Weight (lbs): 308 RPSGT: Peak, Robert BMI: 47 MRN: [id number] Neck Size: <br>  CLINICAL INFORMATION Sleep Study Type: NPSG  Indication for sleep study: snoring, sleep disruption, and daytime sleepiness.  Epworth Sleepiness Score: 13  SLEEP STUDY TECHNIQUE As per the AASM Manual for the Scoring of Sleep and Associated Events v2.3 (April 2016) with a hypopnea requiring 4% desaturations.  The channels recorded and monitored were frontal, central and occipital EEG, electrooculogram (EOG), submentalis EMG (chin), nasal and oral airflow, thoracic and abdominal wall motion, anterior tibialis EMG, snore microphone, electrocardiogram, and pulse oximetry.  MEDICATIONS Medications self-administered by patient taken the night of the study : N/A  SLEEP ARCHITECTURE The study was initiated at 9:41:06 PM and ended at 4:00:03 AM.  Sleep onset time was 9.8 minutes and the sleep efficiency was 79.8%%. The total sleep time was 302.5 minutes.  Stage REM latency was 209.5 minutes.  The patient spent 12.1%% of the night in stage N1 sleep, 82.0%% in stage N2 sleep, 0.0%% in stage N3 and 6% in REM.  Alpha intrusion was absent.  Supine sleep was 51.74%.  RESPIRATORY PARAMETERS The overall apnea/hypopnea index (AHI) was 15.9 per hour. There were 22 total apneas, including 16 obstructive, 6 central and 0 mixed apneas. There were 58 hypopneas and 0 RERAs.  The AHI during Stage REM sleep was 60.0 per hour.  AHI while supine was 17.6 per hour.  The mean oxygen saturation was 93.6%. The minimum SpO2 during sleep was 83.0%.  moderate snoring was noted during this study.  CARDIAC DATA The 2 lead EKG demonstrated sinus rhythm. The mean heart rate was 69.3 beats per minute. Other EKG  findings include: None.  LEG MOVEMENT DATA The total PLMS were 0 with a resulting PLMS index of 0.0. Associated arousal with leg movement index was 0.0 .  IMPRESSIONS - Moderate obstructive sleep apnea with an AHI of 15.9 and SpO2 low of 83%.  He had a significant REM effect with REM AHI of 60/h. - The patient snored with moderate snoring volume. - No cardiac abnormalities were noted during this study. - Clinically significant periodic limb movements did not occur during sleep. No significant associated arousals.  DIAGNOSIS - Obstructive Sleep Apnea (G47.33)  RECOMMENDATIONS - Additional therapies include CPAP, oral appliance, or surgical assessment. - Avoid alcohol, sedatives and other CNS depressants that may worsen sleep apnea and disrupt normal sleep architecture. - Sleep hygiene should be reviewed to assess factors that may improve sleep quality. - Weight management and regular exercise should be initiated or continued if appropriate.  [Electronically signed] 12/18/2020 02:06 PM  Coralyn Helling MD, ABSM Diplomate, American Board of Sleep Medicine   NPI: [id number]

## 2020-12-18 NOTE — Assessment & Plan Note (Signed)
On Lithium, Abilify and Lamictal Takes Atomoxetine for resistant depression Follows up with Psychiatrist

## 2020-12-18 NOTE — Assessment & Plan Note (Addendum)
HbA1C: 9.4 today in office Home blood glucose readings reviewed, around 200 most of the time. On Novolin 70/30, takes 30 U TID and Metformin 500 mg BID Continue Lantus 30 U qHS Continue Glipizide Blood glucose more than 200s at home Added Ozempic 0.25 mg, plan to increase as tolerated Advised to comply with the treatment Advised to follow diabetic diet F/u CMP and lipid panel

## 2020-12-18 NOTE — Assessment & Plan Note (Signed)
Had sleep study recently Has not received CPAP device yet F/u Pulmonology

## 2020-12-18 NOTE — Patient Instructions (Addendum)
Please start taking Ozempic as prescribed.  Please continue taking other medications as prescribed.  Please continue to check blood glucose regularly and bring the log in the next visit.

## 2020-12-19 NOTE — Telephone Encounter (Signed)
Called and went over sleep study results per Dr Craige Cotta with patient. All questions answered and patient expressed full understanding. Scheduled office visit with Dr Craige Cotta for Tuesday, 12/30/2020 at 9:15am at the [facility] office per patient request. Nothing further needed at this time.

## 2020-12-30 ENCOUNTER — Ambulatory Visit: Payer: Medicaid Other | Admitting: Pulmonary Disease

## 2020-12-31 ENCOUNTER — Other Ambulatory Visit: Payer: Self-pay | Admitting: Internal Medicine

## 2020-12-31 ENCOUNTER — Telehealth: Payer: Self-pay | Admitting: *Deleted

## 2020-12-31 DIAGNOSIS — E119 Type 2 diabetes mellitus without complications: Secondary | ICD-10-CM

## 2020-12-31 MED ORDER — TRULICITY 0.75 MG/0.5ML ~~LOC~~ SOAJ: 0.7500 mg | SUBCUTANEOUS | 0 refills | Status: DC

## 2020-12-31 NOTE — Telephone Encounter (Signed)
Per Dr Allena Katz called pt to let him know the ozempic was not approved so trulicity had been sent in instead. Need to let him know to call in the next week to let us know how he is doing with this medication

## 2021-01-02 ENCOUNTER — Other Ambulatory Visit: Payer: Self-pay | Admitting: Internal Medicine

## 2021-01-02 DIAGNOSIS — E119 Type 2 diabetes mellitus without complications: Secondary | ICD-10-CM

## 2021-01-15 ENCOUNTER — Other Ambulatory Visit: Payer: Self-pay | Admitting: Internal Medicine

## 2021-01-15 ENCOUNTER — Other Ambulatory Visit: Payer: Self-pay | Admitting: Family Medicine

## 2021-01-15 DIAGNOSIS — Z8619 Personal history of other infectious and parasitic diseases: Secondary | ICD-10-CM

## 2021-01-15 DIAGNOSIS — E119 Type 2 diabetes mellitus without complications: Secondary | ICD-10-CM

## 2021-01-15 MED ORDER — TRULICITY 0.75 MG/0.5ML ~~LOC~~ SOAJ: 0.7500 mg | SUBCUTANEOUS | 0 refills | Status: DC

## 2021-01-15 MED ORDER — LIDOCAINE-PRILOCAINE 2.5-2.5 % EX CREA: 1.0000 "application " | TOPICAL_CREAM | CUTANEOUS | 0 refills | Status: DC | PRN

## 2021-01-24 ENCOUNTER — Other Ambulatory Visit: Payer: Self-pay | Admitting: Internal Medicine

## 2021-01-24 DIAGNOSIS — E119 Type 2 diabetes mellitus without complications: Secondary | ICD-10-CM

## 2021-01-27 ENCOUNTER — Encounter: Payer: Self-pay | Admitting: Pulmonary Disease

## 2021-01-27 ENCOUNTER — Other Ambulatory Visit: Payer: Self-pay

## 2021-01-27 ENCOUNTER — Ambulatory Visit (INDEPENDENT_AMBULATORY_CARE_PROVIDER_SITE_OTHER): Payer: Medicaid Other | Admitting: Pulmonary Disease

## 2021-01-27 VITALS — BP 130/82 | HR 83 | Temp 97.8°F | Ht 68.0 in | Wt 302.4 lb

## 2021-01-27 DIAGNOSIS — G4730 Sleep apnea, unspecified: Secondary | ICD-10-CM

## 2021-01-27 DIAGNOSIS — J411 Mucopurulent chronic bronchitis: Secondary | ICD-10-CM

## 2021-01-27 DIAGNOSIS — G4733 Obstructive sleep apnea (adult) (pediatric): Secondary | ICD-10-CM

## 2021-01-27 DIAGNOSIS — Z720 Tobacco use: Secondary | ICD-10-CM | POA: Diagnosis not present

## 2021-01-27 DIAGNOSIS — E669 Obesity, unspecified: Secondary | ICD-10-CM

## 2021-01-27 DIAGNOSIS — Z7189 Other specified counseling: Secondary | ICD-10-CM

## 2021-01-27 NOTE — Patient Instructions (Signed)
Will arrange for auto CPAP set up  Follow up in 4 to 6 months

## 2021-01-27 NOTE — Progress Notes (Signed)
[facility]  Chief Complaint  Patient presents with  . Follow-up    Follow up post HST to go over treatment options    Constitutional:  BP 130/82 (BP Location: Left Arm, Cuff Size: Normal)   Pulse 83   Temp 97.8 F (36.6 C) (Other (Comment)) Comment (Src): wrist  Ht _0  (1.727 m)   Wt (!) 302 lb 6.4 oz (137.2 kg)   SpO2 98% Comment: Room air  BMI 45.98 kg/m   Past Medical History:  Anxiety, OA, Depression, DM type 2, HLD  Past Surgical History:  He has not had any prior surgeries.  Brief Summary:  Isaac Weiss is a 40 y.o. male smoker with obstructive sleep apnea and asthma.      Subjective:   Chest xray from February showed changes suggestive of bronchitis.  PFT was normal.  Sleep study showed moderate sleep apnea, but severe in REM sleep.  He tried CPAP before years ago and had trouble with mask fit.  Flovent has helped improve his breathing.  Hasn't needed to use albuterol for past few weeks.    He doesn't feel like this is the right time for him to quit smoking.  Physical Exam:   Appearance - well kempt   ENMT - no sinus tenderness, no oral exudate, no LAN, Mallampati 3 airway, no stridor, elongated uvula, 2+ tonsils  Respiratory - equal breath sounds bilaterally, no wheezing or rales  CV - s1s2 regular rate and rhythm, no murmurs  Ext - no clubbing, no edema  Skin - no rashes  Psych - normal mood and affect   Pulmonary testing:  PFT 12/16/20 >> FEV1 3.19 (79%), FEV1% 77, TLC 6.16 (94%), DLCO 90%  Sleep Tests:  PSG 12/10/20 >> AHI 15.9, SpO2 low 83%.  REM AHI 60.  Social History:  He  reports that he has been smoking cigarettes. He has been smoking about 1.00 pack per day. He has never used smokeless tobacco. He reports current alcohol use. He reports that he does not use drugs.  Family History:  His family history is not on file.     Assessment/Plan:   Obstructive sleep apnea. - reviewed his  sleep study - discussed how untreated sleep apnea can impact his health - treatment options reviewed - will arrange for auto CPAP 5 to 15 cm H2O - he has upcoming dental appointment and will discuss with his dentist whether an oral appliance could be an option  Chronic bronchitis. - continue flovent - prn albuterol  Tobacco abuse. - advised that when he is ready to quit smoking there are options to assist with the process  Obesity. - discussed how weight can impact sleep and risk for sleep disordered breathing - discussed options to assist with weight loss: combination of diet modification, cardiovascular and strength training exercises  Time Spent Involved in Patient Care on Day of Examination:  22 minutes  Follow up:  Patient Instructions  Will arrange for auto CPAP set up  Follow up in 4 to 6 months   Medication List:   Allergies as of 01/27/2021   No Known Allergies     Medication List       Accurate as of January 27, 2021 12:21 PM. If you have any questions, ask your nurse or doctor.        ACCU-CHEK COMPACT CARE KIT Kit 1 kit by Does not apply route 4 (four) times daily -  before meals and at bedtime.  Accu-Chek FastClix Lancets Misc USE TO CHECK BLOOD SUGAR FOUR TIMES DAILY BEFORE MEALS AND AT BEDTIME   Accu-Chek Guide test strip Generic drug: glucose blood USE TO TEST BLOOD SUGAR THREE TIMES DAILY   albuterol 108 (90 Base) MCG/ACT inhaler Commonly known as: VENTOLIN HFA Inhale 2 puffs into the lungs every 6 (six) hours as needed for wheezing or shortness of breath.   ARIPiprazole 5 MG tablet Commonly known as: ABILIFY Take 5 mg by mouth daily.   atomoxetine 40 MG capsule Commonly known as: STRATTERA Take 40 mg by mouth every morning.   Flovent HFA 110 MCG/ACT inhaler Generic drug: fluticasone Inhale 2 puffs into the lungs 2 (two) times daily.   glipiZIDE 10 MG tablet Commonly known as: GLUCOTROL TAKE 1 TABLET BY MOUTH TWICE DAILY BEFORE a  meal   HumuLIN 70/30 (70-30) 100 UNIT/ML injection Generic drug: insulin NPH-regular Human Inject 30 Units into the skin 3 (three) times daily before meals.   Insulin Syringe 27G X 1/2" 1 ML Misc Inject 10 Units into the skin 3 (three) times daily before meals.   B-D INS SYR MICROFINE 1CC/28G 28G X 1/2" 1 ML Misc Generic drug: INS SYRINGE/NEEDLE 1CC/28G USE WITH INSULIN THREE TIMES DAILY BEFORE MEALS   lamoTRIgine 100 MG tablet Commonly known as: LAMICTAL Take 100 mg by mouth 2 (two) times daily.   Lantus 100 UNIT/ML injection Generic drug: insulin glargine INJECT 30 UNITS into THE SKIN DAILY   lidocaine-prilocaine cream Commonly known as: EMLA Apply 1 application topically as needed.   lithium carbonate 450 MG CR tablet Commonly known as: ESKALITH Take 900 mg by mouth daily.   metFORMIN 500 MG tablet Commonly known as: GLUCOPHAGE Take 1,000 mg by mouth 2 (two) times daily with a meal.   prazosin 1 MG capsule Commonly known as: MINIPRESS Take 2 mg by mouth at bedtime.   propranolol 20 MG tablet Commonly known as: INDERAL Take 20 mg by mouth 3 (three) times daily.   sildenafil 50 MG tablet Commonly known as: VIAGRA Take 1 tablet (50 mg total) by mouth daily as needed for erectile dysfunction.   Trulicity 2.24 MG/5.0IB Sopn Generic drug: Dulaglutide Inject 0.75 mg into the skin once a week.       Signature:  Chesley Mires, MD [place] Pager - [phone] 01/27/2021, 12:21 PM

## 2021-01-29 ENCOUNTER — Ambulatory Visit: Payer: Medicaid Other | Admitting: Internal Medicine

## 2021-02-09 ENCOUNTER — Other Ambulatory Visit: Payer: Self-pay

## 2021-02-09 ENCOUNTER — Encounter: Payer: Self-pay | Admitting: Internal Medicine

## 2021-02-09 ENCOUNTER — Telehealth (INDEPENDENT_AMBULATORY_CARE_PROVIDER_SITE_OTHER): Payer: Medicaid Other | Admitting: Internal Medicine

## 2021-02-09 DIAGNOSIS — J019 Acute sinusitis, unspecified: Secondary | ICD-10-CM

## 2021-02-09 DIAGNOSIS — E119 Type 2 diabetes mellitus without complications: Secondary | ICD-10-CM | POA: Diagnosis not present

## 2021-02-09 MED ORDER — AZITHROMYCIN 250 MG PO TABS: ORAL_TABLET | ORAL | 0 refills | Status: DC

## 2021-02-09 MED ORDER — TRULICITY 1.5 MG/0.5ML ~~LOC~~ SOAJ: 1.5000 mg | SUBCUTANEOUS | 0 refills | Status: DC

## 2021-02-09 NOTE — Assessment & Plan Note (Signed)
HbA1C: 9.4 Home blood glucose readings better now with   On Novolin 70/30, takes 30 U TID and Metformin 500 mg BID Continue Lantus 30 U qHS Continue Glipizide Blood glucose more than 200s at home Increased Trulicity to 1.5 mg every week Advised to comply with the treatment Advised to follow diabetic diet F/u CMP and lipid panel

## 2021-02-09 NOTE — Patient Instructions (Signed)
Please start taking Trulicity 1.5 mg instead of 0.75 mg once weekly.  Continue to take other medications as prescribed.  Please start taking Azithromycin for acute sinusitis. Okay to use nasal saline spray for nasal congestion.  Recommend to use humidifier to avoid dry air at nighttime.

## 2021-02-09 NOTE — Telephone Encounter (Signed)
Changed appt to virtual let him  know dr patel would work him back in

## 2021-02-09 NOTE — Progress Notes (Signed)
Virtual Visit via Telephone Note   This visit type was conducted due to national recommendations for restrictions regarding the COVID-19 Pandemic (e.g. social distancing) in an effort to limit this patient's exposure and mitigate transmission in our community.  Due to his co-morbid illnesses, this patient is at least at moderate risk for complications without adequate follow up.  This format is felt to be most appropriate for this patient at this time.  The patient did not have access to video technology/had technical difficulties with video requiring transitioning to audio format only (telephone).  All issues noted in this document were discussed and addressed.  No physical exam could be performed with this format.  Evaluation Performed:  Follow-up visit  Date:  02/09/2021   ID:  Isaac Weiss, DOB 11/10/1980, MRN [id number]  Patient Location: Home Provider Location: Office/Clinic  Participants: Patient Location of Patient: Home Location of Provider: Telehealth Consent was obtain for visit to be over via telehealth. I verified that I am speaking with the correct person using two identifiers.  PCP:  Lindell Spar, MD   Chief Complaint:    History of Present Illness:    Isaac Weiss is a 40 y.o. male with past medical history of uncontrolled diabetes mellitus, COPD, sleep apnea, bipolar disorder disorder, anxiety, PTSD, tobacco abuse and morbid obesitywho has a televisit for follow up of DM.  He has started taking Trulicity and has been tolerating it well. His blood glucose have improved now, around 120-200. He continues to take insulin, Metformin and Glipizide. He denies any polyuria or polyphagia.  He has been having runny nose, nasal congestion and dry cough for about a week. He denies fever, chills or sore throat. His COVID test was negative.  The patient does not have symptoms concerning for COVID-19 infection (fever, chills, cough, or new shortness of breath).    Past Medical, Surgical, Social History, Allergies, and Medications have been Reviewed.  Past Medical History:  Diagnosis Date  . Anxiety    Phreesia 07/27/2020  . Arthritis    Phreesia 07/27/2020  . Asthma   . COPD (chronic obstructive pulmonary disease) ([place])   . Depression    Phreesia 07/27/2020  . Diabetes mellitus without complication ([place])   . Hyperlipidemia    Phreesia 07/27/2020  . Neuromuscular disorder ([place])    Phreesia 07/27/2020  . Sleep apnea    Phreesia 07/27/2020   History reviewed. No pertinent surgical history.   Current Meds  Medication Sig  . Accu-Chek FastClix Lancets MISC USE TO CHECK BLOOD SUGAR FOUR TIMES DAILY BEFORE MEALS AND AT BEDTIME  . ACCU-CHEK GUIDE test strip USE TO TEST BLOOD SUGAR THREE TIMES DAILY  . albuterol (VENTOLIN HFA) 108 (90 Base) MCG/ACT inhaler Inhale 2 puffs into the lungs every 6 (six) hours as needed for wheezing or shortness of breath.  . ARIPiprazole (ABILIFY) 5 MG tablet Take 5 mg by mouth daily.  Marland Kitchen atomoxetine (STRATTERA) 40 MG capsule Take 40 mg by mouth every morning.  Marland Kitchen azithromycin (ZITHROMAX) 250 MG tablet Take as package instructions.  . B-D INS SYR MICROFINE 1CC/28G 28G X 1/2" 1 ML MISC USE WITH INSULIN THREE TIMES DAILY BEFORE MEALS  . Blood Glucose Monitoring Suppl (ACCU-CHEK COMPACT CARE KIT) KIT 1 kit by Does not apply route 4 (four) times daily -  before meals and at bedtime.  . Dulaglutide (TRULICITY) 1.5 HT/3.4KA SOPN Inject 1.5 mg into the skin once a week.  . fluticasone (FLOVENT HFA) 110 MCG/ACT inhaler Inhale  2 puffs into the lungs 2 (two) times daily.  Marland Kitchen glipiZIDE (GLUCOTROL) 10 MG tablet TAKE 1 TABLET BY MOUTH TWICE DAILY BEFORE a meal  . insulin NPH-regular Human (HUMULIN 70/30) (70-30) 100 UNIT/ML injection Inject 30 Units into the skin 3 (three) times daily before meals.  . Insulin Syringe 27G X 1/2" 1 ML MISC Inject 10 Units into the skin 3 (three) times daily before meals.  . lamoTRIgine (LAMICTAL)  100 MG tablet Take 100 mg by mouth 2 (two) times daily.  Marland Kitchen LANTUS 100 UNIT/ML injection INJECT 30 UNITS into THE SKIN DAILY  . lidocaine-prilocaine (EMLA) cream Apply 1 application topically as needed.  . lithium carbonate (ESKALITH) 450 MG CR tablet Take 900 mg by mouth daily.  . metFORMIN (GLUCOPHAGE) 500 MG tablet Take 1,000 mg by mouth 2 (two) times daily with a meal.  . prazosin (MINIPRESS) 1 MG capsule Take 2 mg by mouth at bedtime.  . propranolol (INDERAL) 20 MG tablet Take 20 mg by mouth 3 (three) times daily.  . [DISCONTINUED] Dulaglutide (TRULICITY) 9.60 AV/4.0JW SOPN Inject 0.75 mg into the skin once a week.     Allergies:   Patient has no known allergies.   ROS:   Please see the history of present illness.     All other systems reviewed and are negative.   Labs/Other Tests and Data Reviewed:    Recent Labs: 09/17/2020: ALT 20; BUN 12; Creatinine, Ser 0.93; Hemoglobin 16.2; Platelets 291; Potassium 4.5; Sodium 141; TSH 6.680   Recent Lipid Panel Lab Results  Component Value Date/Time   CHOL 151 09/17/2020 02:49 PM   TRIG 157 (H) 09/17/2020 02:49 PM   HDL 34 (L) 09/17/2020 02:49 PM   CHOLHDL 4.4 09/17/2020 02:49 PM   LDLCALC 89 09/17/2020 02:49 PM    Wt Readings from Last 3 Encounters:  01/27/21 (!) 302 lb 6.4 oz (137.2 kg)  12/18/20 (!) 307 lb 6.4 oz (139.4 kg)  12/04/20 (!) 307 lb 6.4 oz (139.4 kg)      ASSESSMENT & PLAN:    Diabetes mellitus without complication (HCC) [id number]: 9.4 Home blood glucose readings better now with   On Novolin 70/30, takes 30 U TID and Metformin 500 mg BID Continue Lantus 30 U qHS Continue Glipizide Blood glucose more than 200s at home Increased Trulicity to 1.5 mg every week Advised to comply with the treatment Advised to follow diabetic diet F/u CMP and lipid panel   Acute sinusitis Started Azithromycin as symptoms persistent with symptomatic treatment Nasal saline spray Mucinex PRN Advised to use humidifier to avoid  dry air at nighttime.  Time:   Today, I have spent 16 minutes reviewing the chart, including problem list, medications, and with the patient with telehealth technology discussing the above problems.   Medication Adjustments/Labs and Tests Ordered: Current medicines are reviewed at length with the patient today.  Concerns regarding medicines are outlined above.   Tests Ordered: No orders of the defined types were placed in this encounter.   Medication Changes: Meds ordered this encounter  Medications  . Dulaglutide (TRULICITY) 1.5 NW/2.9FA SOPN    Sig: Inject 1.5 mg into the skin once a week.    Dispense:  6 mL    Refill:  0  . azithromycin (ZITHROMAX) 250 MG tablet    Sig: Take as package instructions.    Dispense:  6 tablet    Refill:  0     Note: This dictation was prepared with Dragon dictation along with smaller phrase  technology. Similar sounding words can be transcribed inadequately or may not be corrected upon review. Any transcriptional errors that result from this process are unintentional.      Disposition:  Follow up  Signed, Lindell Spar, MD  02/09/2021 12:11 PM     [facility]

## 2021-02-11 ENCOUNTER — Other Ambulatory Visit: Payer: Self-pay | Admitting: Internal Medicine

## 2021-02-11 DIAGNOSIS — E119 Type 2 diabetes mellitus without complications: Secondary | ICD-10-CM

## 2021-03-03 ENCOUNTER — Other Ambulatory Visit: Payer: Self-pay | Admitting: Family Medicine

## 2021-03-03 ENCOUNTER — Other Ambulatory Visit: Payer: Self-pay | Admitting: Internal Medicine

## 2021-03-11 ENCOUNTER — Other Ambulatory Visit: Payer: Self-pay

## 2021-03-11 ENCOUNTER — Ambulatory Visit (INDEPENDENT_AMBULATORY_CARE_PROVIDER_SITE_OTHER): Payer: Medicaid Other | Admitting: Internal Medicine

## 2021-03-11 ENCOUNTER — Encounter: Payer: Self-pay | Admitting: Internal Medicine

## 2021-03-11 VITALS — BP 125/86 | HR 75 | Resp 16 | Ht 68.0 in | Wt 300.0 lb

## 2021-03-11 DIAGNOSIS — E119 Type 2 diabetes mellitus without complications: Secondary | ICD-10-CM

## 2021-03-11 DIAGNOSIS — M109 Gout, unspecified: Secondary | ICD-10-CM

## 2021-03-11 DIAGNOSIS — E038 Other specified hypothyroidism: Secondary | ICD-10-CM

## 2021-03-11 MED ORDER — COLCHICINE 0.6 MG PO TABS: ORAL_TABLET | ORAL | 0 refills | Status: DC

## 2021-03-11 NOTE — Progress Notes (Signed)
Acute Office Visit  Subjective:    Patient ID: Isaac Weiss, male    DOB: 10/02/1981, 40 y.o.   MRN: [id number]  Chief Complaint  Patient presents with  . Foot Pain    X 3 days, can't put pressure on left heel     HPI Patient is in today for evaluation of left ankle and right toe pain for last 3 days. Pain is constant, worse with movement and weight-bearing and mildly relieved with Ibuprofen. Denies any recent injury. Denies any fever, chills. He reports h/o gout and was on Allopurinol in the past, when he used to drink alcohol. Has not had a gout flare for more than a year.  Past Medical History:  Diagnosis Date  . Anxiety    Phreesia 07/27/2020  . Arthritis    Phreesia 07/27/2020  . Asthma   . COPD (chronic obstructive pulmonary disease) ([place])   . Depression    Phreesia 07/27/2020  . Diabetes mellitus without complication ([place])   . Hyperlipidemia    Phreesia 07/27/2020  . Neuromuscular disorder ([place])    Phreesia 07/27/2020  . Sleep apnea    Phreesia 07/27/2020    History reviewed. No pertinent surgical history.  History reviewed. No pertinent family history.  Social History   Socioeconomic History  . Marital status: Married    Spouse name: Not on file  . Number of children: Not on file  . Years of education: Not on file  . Highest education level: Not on file  Occupational History  . Not on file  Tobacco Use  . Smoking status: Current Every Day Smoker    Packs/day: 1.00    Types: Cigarettes  . Smokeless tobacco: Never Used  . Tobacco comment: smokes 1/2 pack per day 01/27/2021  Vaping Use  . Vaping Use: Never used  Substance and Sexual Activity  . Alcohol use: Yes  . Drug use: Never  . Sexual activity: Not on file  Other Topics Concern  . Not on file  Social History Narrative  . Not on file   Social Determinants of Health   Financial Resource Strain: Not on file  Food Insecurity: Not on file  Transportation Needs: Not on file  Physical  Activity: Not on file  Stress: Not on file  Social Connections: Not on file  Intimate Partner Violence: Not on file    Outpatient Medications Prior to Visit  Medication Sig Dispense Refill  . Accu-Chek FastClix Lancets MISC USE TO CHECK BLOOD SUGAR FOUR TIMES DAILY BEFORE MEALS AND AT BEDTIME 102 each 10  . ACCU-CHEK GUIDE test strip USE TO TEST BLOOD SUGAR THREE TIMES DAILY 100 strip 10  . albuterol (VENTOLIN HFA) 108 (90 Base) MCG/ACT inhaler Inhale 2 puffs into the lungs every 6 (six) hours as needed for wheezing or shortness of breath. 6.7 g 5  . ARIPiprazole (ABILIFY) 5 MG tablet Take 5 mg by mouth daily.    Marland Kitchen atomoxetine (STRATTERA) 40 MG capsule Take 40 mg by mouth every morning.    . B-D INS SYR MICROFINE 1CC/28G 28G X 1/2" 1 ML MISC USE WITH INSULIN THREE TIMES DAILY BEFORE MEALS 100 each 0  . Blood Glucose Monitoring Suppl (ACCU-CHEK COMPACT CARE KIT) KIT 1 kit by Does not apply route 4 (four) times daily -  before meals and at bedtime. 1 kit 0  . Dulaglutide (TRULICITY) 1.5 JO/8.4ZY SOPN Inject 1.5 mg into the skin once a week. 6 mL 0  . fluticasone (FLOVENT HFA) 110 MCG/ACT inhaler  Inhale 2 puffs into the lungs 2 (two) times daily. 1 each 5  . glipiZIDE (GLUCOTROL) 10 MG tablet TAKE 1 TABLET BY MOUTH TWICE DAILY BEFORE a meal 60 tablet 3  . HUMULIN 70/30 (70-30) 100 UNIT/ML injection INJECT 30 UNITS INTO THE SKIN THREE TIMES DAILY BEFORE MEALS 20 mL 7  . Insulin Syringe 27G X 1/2" 1 ML MISC Inject 10 Units into the skin 3 (three) times daily before meals. 200 each 3  . lamoTRIgine (LAMICTAL) 100 MG tablet Take 100 mg by mouth 2 (two) times daily.    Marland Kitchen LANTUS 100 UNIT/ML injection INJECT 30 UNITS into THE SKIN DAILY 10 mL 2  . lidocaine-prilocaine (EMLA) cream Apply 1 application topically as needed. 30 g 0  . lithium carbonate (ESKALITH) 450 MG CR tablet Take 900 mg by mouth daily.    . metFORMIN (GLUCOPHAGE) 500 MG tablet Take 1,000 mg by mouth 2 (two) times daily with a meal.     . prazosin (MINIPRESS) 1 MG capsule Take 2 mg by mouth at bedtime.    . propranolol (INDERAL) 20 MG tablet Take 20 mg by mouth 3 (three) times daily.    Marland Kitchen azithromycin (ZITHROMAX) 250 MG tablet Take as package instructions. 6 tablet 0   No facility-administered medications prior to visit.    No Known Allergies  Review of Systems  Constitutional: Negative for chills and fever.  HENT: Negative for congestion and sore throat.   Eyes: Negative for pain and discharge.  Respiratory: Negative for cough and shortness of breath.   Cardiovascular: Negative for chest pain and palpitations.  Gastrointestinal: Negative for constipation, diarrhea, nausea and vomiting.  Endocrine: Negative for polydipsia and polyuria.  Genitourinary: Negative for dysuria and hematuria.  Musculoskeletal: Positive for arthralgias (Left ankle and right gret toe). Negative for neck pain and neck stiffness.       Right shoulder pain - better now  Skin: Negative for rash and wound.  Neurological: Negative for dizziness, weakness, numbness and headaches.  Psychiatric/Behavioral: Negative for agitation and behavioral problems.       Objective:    Physical Exam Vitals reviewed.  Constitutional:      General: He is not in acute distress.    Appearance: He is obese. He is not diaphoretic.  HENT:     Head: Normocephalic and atraumatic.  Eyes:     General: No scleral icterus.    Extraocular Movements: Extraocular movements intact.  Cardiovascular:     Rate and Rhythm: Normal rate and regular rhythm.     Pulses: Normal pulses.     Heart sounds: Normal heart sounds. No murmur heard.   Pulmonary:     Breath sounds: Normal breath sounds. No wheezing or rales.  Musculoskeletal:        General: Swelling (Left ankle and right 1st MTP) and tenderness (Left ankle) present.     Right shoulder: No swelling, effusion or bony tenderness. Normal range of motion.     Left shoulder: No swelling, effusion or bony tenderness.  Normal range of motion.     Cervical back: Neck supple. No tenderness.     Right lower leg: No edema.     Left lower leg: No edema.  Skin:    General: Skin is warm.  Neurological:     General: No focal deficit present.     Mental Status: He is alert and oriented to person, place, and time.     Sensory: No sensory deficit.     Motor: No  weakness.  Psychiatric:        Mood and Affect: Mood normal.        Behavior: Behavior normal.     BP 125/86   Pulse 75   Resp 16   Ht 5' 8"  (1.727 m)   Wt 300 lb (136.1 kg)   SpO2 94%   BMI 45.61 kg/m  Wt Readings from Last 3 Encounters:  03/11/21 300 lb (136.1 kg)  01/27/21 (!) 302 lb 6.4 oz (137.2 kg)  12/18/20 (!) 307 lb 6.4 oz (139.4 kg)    Health Maintenance Due  Topic Date Due  . PNEUMOCOCCAL POLYSACCHARIDE VACCINE AGE 32-64 HIGH RISK  Never done  . OPHTHALMOLOGY EXAM  Never done  . HIV Screening  Never done  . Hepatitis C Screening  Never done  . TETANUS/TDAP  Never done  . FOOT EXAM  09/17/2020  . HEMOGLOBIN A1C  01/27/2021    There are no preventive care reminders to display for this patient.   Lab Results  Component Value Date   TSH 6.680 (H) 09/17/2020   Lab Results  Component Value Date   WBC 10.1 09/17/2020   HGB 16.2 09/17/2020   HCT 46.2 09/17/2020   MCV 95 09/17/2020   PLT 291 09/17/2020   Lab Results  Component Value Date   NA 141 09/17/2020   K 4.5 09/17/2020   CO2 26 09/17/2020   GLUCOSE 93 09/17/2020   BUN 12 09/17/2020   CREATININE 0.93 09/17/2020   BILITOT 0.4 09/17/2020   ALKPHOS 87 09/17/2020   AST 14 09/17/2020   ALT 20 09/17/2020   PROT 7.0 09/17/2020   ALBUMIN 4.4 09/17/2020   CALCIUM 9.6 09/17/2020   ANIONGAP 12 09/07/2019   Lab Results  Component Value Date   CHOL 151 09/17/2020   Lab Results  Component Value Date   HDL 34 (L) 09/17/2020   Lab Results  Component Value Date   LDLCALC 89 09/17/2020   Lab Results  Component Value Date   TRIG 157 (H) 09/17/2020   Lab  Results  Component Value Date   CHOLHDL 4.4 09/17/2020   Lab Results  Component Value Date   HGBA1C 9.4 (A) 07/30/2020   HGBA1C 9.4 07/30/2020   HGBA1C 9.4 (A) 07/30/2020   HGBA1C 9.4 (A) 07/30/2020       Assessment & Plan:   Problem List Items Addressed This Visit     Other Visit Diagnoses    Acute gout, unspecified cause, unspecified site    -  Primary Prescribed Colchicine Check uric acid level once gout flare resolves Would avoid starting maintenance treatment currently during acute flare    Relevant Medications   colchicine 0.6 MG tablet   Other Relevant Orders   Uric acid   Endocrine  Diabetes mellitus without complication (HCC)  Relevant Orders  CBC  CMP14+EGFR  Lipid panel  Hemoglobin A1c  Subclinical hypothyroidism       Relevant Orders   TSH       Meds ordered this encounter  Medications  . colchicine 0.6 MG tablet    Sig: Take 2 tablets once followed by 1 tablet 1 hour later. Then, take 1 tablet by mouth once daily until resolution of pain.    Dispense:  10 tablet    Refill:  0     [facility] Keith Rake, MD

## 2021-03-11 NOTE — Patient Instructions (Signed)
Gout  Gout is painful swelling of your joints. Gout is a type of arthritis. It is caused by having too much uric acid in your body. Uric acid is a chemical that is made when your body breaks down substances called purines. If your body has too much uric acid, sharp crystals can form and build up in your joints. This causes pain and swelling. Gout attacks can happen quickly and be very painful (acute gout). Over time, the attacks can affect more joints and happen more often (chronic gout). What are the causes?  Too much uric acid in your blood. This can happen because: ? Your kidneys do not remove enough uric acid from your blood. ? Your body makes too much uric acid. ? You eat too many foods that are high in purines. These foods include organ meats, some seafood, and beer.  Trauma or stress. What increases the risk?  Having a family history of gout.  Being male and middle-aged.  Being male and having gone through menopause.  Being very overweight (obese).  Drinking alcohol, especially beer.  Not having enough water in the body (being dehydrated).  Losing weight too quickly.  Having an organ transplant.  Having lead poisoning.  Taking certain medicines.  Having kidney disease.  Having a skin condition called psoriasis. What are the signs or symptoms? An attack of acute gout usually happens in just one joint. The most common place is the big toe. Attacks often start at night. Other joints that may be affected include joints of the feet, ankle, knee, fingers, wrist, or elbow. Symptoms of an attack may include:  Very bad pain.  Warmth.  Swelling.  Stiffness.  Shiny, red, or purple skin.  Tenderness. The affected joint may be very painful to touch.  Chills and fever. Chronic gout may cause symptoms more often. More joints may be involved. You may also have white or yellow lumps (tophi) on your hands or feet or in other areas near your joints.   How is this  treated?  Treatment for this condition has two phases: treating an acute attack and preventing future attacks.  Acute gout treatment may include: ? NSAIDs. ? Steroids. These are taken by mouth or injected into a joint. ? Colchicine. This medicine relieves pain and swelling. It can be given by mouth or through an IV tube.  Preventive treatment may include: ? Taking small doses of NSAIDs or colchicine daily. ? Using a medicine that reduces uric acid levels in your blood. ? Making changes to your diet. You may need to see a food expert (dietitian) about what to eat and drink to prevent gout. Follow these instructions at home: During a gout attack  If told, put ice on the painful area: ? Put ice in a plastic bag. ? Place a towel between your skin and the bag. ? Leave the ice on for 20 minutes, 2-3 times a day.  Raise (elevate) the painful joint above the level of your heart as often as you can.  Rest the joint as much as possible. If the joint is in your leg, you may be given crutches.  Follow instructions from your doctor about what you cannot eat or drink.   Avoiding future gout attacks  Eat a low-purine diet. Avoid foods and drinks such as: ? Liver. ? Kidney. ? Anchovies. ? Asparagus. ? Herring. ? Mushrooms. ? Mussels. ? Beer.  Stay at a healthy weight. If you want to lose weight, talk with your doctor. Do   not lose weight too fast.  Start or continue an exercise plan as told by your doctor. Eating and drinking  Drink enough fluids to keep your pee (urine) pale yellow.  If you drink alcohol: ? Limit how much you use to:  0-1 drink a day for women.  0-2 drinks a day for men. ? Be aware of how much alcohol is in your drink. In the [place] one drink equals one 12 oz bottle of beer (355 mL), one 5 oz glass of wine (148 mL), or one 1 oz glass of hard liquor (44 mL). General instructions  Take over-the-counter and prescription medicines only as told by your doctor.  Do  not drive or use heavy machinery while taking prescription pain medicine.  Return to your normal activities as told by your doctor. Ask your doctor what activities are safe for you.  Keep all follow-up visits as told by your doctor. This is important. Contact a doctor if:  You have another gout attack.  You still have symptoms of a gout attack after 10 days of treatment.  You have problems (side effects) because of your medicines.  You have chills or a fever.  You have burning pain when you pee (urinate).  You have pain in your lower back or belly. Get help right away if:  You have very bad pain.  Your pain cannot be controlled.  You cannot pee. Summary  Gout is painful swelling of the joints.  The most common site of pain is the big toe, but it can affect other joints.  Medicines and avoiding some foods can help to prevent and treat gout attacks. This information is not intended to replace advice given to you by your health care provider. Make sure you discuss any questions you have with your health care provider. Document Revised: 05/10/2018 Document Reviewed: 05/10/2018 Elsevier Patient Education  2021 [employer]  

## 2021-03-13 ENCOUNTER — Other Ambulatory Visit: Payer: Self-pay | Admitting: Internal Medicine

## 2021-04-02 ENCOUNTER — Other Ambulatory Visit: Payer: Self-pay | Admitting: Internal Medicine

## 2021-04-16 ENCOUNTER — Other Ambulatory Visit: Payer: Self-pay | Admitting: Internal Medicine

## 2021-04-16 DIAGNOSIS — E119 Type 2 diabetes mellitus without complications: Secondary | ICD-10-CM

## 2021-04-24 ENCOUNTER — Encounter: Payer: Self-pay | Admitting: Internal Medicine

## 2021-04-24 LAB — HM DIABETES EYE EXAM

## 2021-05-01 ENCOUNTER — Other Ambulatory Visit: Payer: Self-pay | Admitting: Internal Medicine

## 2021-05-01 DIAGNOSIS — E119 Type 2 diabetes mellitus without complications: Secondary | ICD-10-CM

## 2021-05-04 ENCOUNTER — Other Ambulatory Visit: Payer: Self-pay | Admitting: Internal Medicine

## 2021-05-04 DIAGNOSIS — E119 Type 2 diabetes mellitus without complications: Secondary | ICD-10-CM

## 2021-05-07 ENCOUNTER — Other Ambulatory Visit: Payer: Self-pay | Admitting: Internal Medicine

## 2021-05-11 ENCOUNTER — Ambulatory Visit: Payer: Medicaid Other | Admitting: Internal Medicine

## 2021-06-05 ENCOUNTER — Other Ambulatory Visit: Payer: Self-pay | Admitting: Internal Medicine

## 2021-06-08 ENCOUNTER — Ambulatory Visit: Payer: Medicaid Other | Admitting: Internal Medicine

## 2021-06-17 ENCOUNTER — Other Ambulatory Visit: Payer: Self-pay

## 2021-06-17 ENCOUNTER — Ambulatory Visit (HOSPITAL_COMMUNITY)
Admission: RE | Admit: 2021-06-17 | Discharge: 2021-06-17 | Disposition: A | Discharge status: Home / Self Care | Payer: Medicaid Other | Source: Ambulatory Visit | Attending: Internal Medicine | Admitting: Internal Medicine

## 2021-06-17 ENCOUNTER — Ambulatory Visit (INDEPENDENT_AMBULATORY_CARE_PROVIDER_SITE_OTHER): Payer: Medicaid Other | Admitting: Internal Medicine

## 2021-06-17 ENCOUNTER — Encounter: Payer: Self-pay | Admitting: Internal Medicine

## 2021-06-17 VITALS — BP 128/81 | HR 67 | Temp 98.4°F | Resp 18 | Ht 68.0 in | Wt 284.0 lb

## 2021-06-17 DIAGNOSIS — J411 Mucopurulent chronic bronchitis: Secondary | ICD-10-CM

## 2021-06-17 DIAGNOSIS — M5441 Lumbago with sciatica, right side: Secondary | ICD-10-CM | POA: Diagnosis not present

## 2021-06-17 DIAGNOSIS — G4733 Obstructive sleep apnea (adult) (pediatric): Secondary | ICD-10-CM

## 2021-06-17 DIAGNOSIS — F3160 Bipolar disorder, current episode mixed, unspecified: Secondary | ICD-10-CM

## 2021-06-17 DIAGNOSIS — M5442 Lumbago with sciatica, left side: Secondary | ICD-10-CM | POA: Insufficient documentation

## 2021-06-17 DIAGNOSIS — G8929 Other chronic pain: Secondary | ICD-10-CM

## 2021-06-17 DIAGNOSIS — Z1159 Encounter for screening for other viral diseases: Secondary | ICD-10-CM

## 2021-06-17 DIAGNOSIS — E119 Type 2 diabetes mellitus without complications: Secondary | ICD-10-CM

## 2021-06-17 DIAGNOSIS — Z114 Encounter for screening for human immunodeficiency virus [HIV]: Secondary | ICD-10-CM

## 2021-06-17 DIAGNOSIS — Z720 Tobacco use: Secondary | ICD-10-CM

## 2021-06-17 NOTE — Assessment & Plan Note (Signed)
Had sleep study recently Has not received CPAP device yet F/u Pulmonology 

## 2021-06-17 NOTE — Assessment & Plan Note (Addendum)
Check X-ray lumbar spine Would start Gabapentin after checking with Psychiatrist about other medications Avoid heavy lifting Heating pad and/or ice Tylenol PRN

## 2021-06-17 NOTE — Assessment & Plan Note (Addendum)
HbA1C: 9.4, needs updated blood tests Home blood glucose readings better now with   On Metformin 500 mg BID and Glipizide 10 mg BID  Continue Lantus 30 U qHS Blood glucose around 100s at home most of the time, according to the patient Continue Trulicity to 1.5 mg every week, will adjust according to HbA1C Advised to comply with the treatment Advised to follow diabetic diet F/u CMP and lipid panel

## 2021-06-17 NOTE — Patient Instructions (Signed)
Please continue taking medications as prescribed.  Please schedule appointment with your Psychiatrist. Please ask them about Gabapentin if it is okay to take it with your other Psychiatric medications.  Please continue to follow low carb diet and perform moderate exercise/walking at least 150 mins/week.  Please get X-ray of the lumbar spine done at [facility].

## 2021-06-17 NOTE — Progress Notes (Signed)
Established Patient Office Visit  Subjective:  Patient ID: Isaac Weiss, male    DOB: [birth year]  Age: 40 y.o. MRN: [id number]  CC:  Chief Complaint  Patient presents with   Follow-up    3 month follow up still having neck back and leg pain     HPI Isaac Weiss is a 40 year old male with past medical history of uncontrolled diabetes mellitus, COPD, sleep apnea, bipolar disorder disorder, anxiety, PTSD, tobacco abuse and morbid obesity who presents for follow up of his chronic medical conditions.  DM: He has been taking Lantus 30 U qHS and Trulicity. His blood glucose ranges around 100s most of the time. He also takes Metformin and Glipizide. He denies any polyuria or polyphagia.  He had sleep study done, but has not received CPAP device yet.  He c/o back pain, which has been present for a long time. Pain is dull, radiating to b/l LE, mostly left side and is associated with intermittent numbness of LE. He has difficulty walking at times due to the pain. Denies any saddle anesthesia or urine or stool incontinence.  He follows up with Psychiatrist for bipolar disorder, but he needs to schedule an appointment as he has run out of Abilify. States that he has been feeling different lately. Denies any SI or HI.  Past Medical History:  Diagnosis Date   Anxiety    [employer]reesia 07/27/2020   Arthritis    [employer]reesia 07/27/2020   Asthma    COPD (chronic obstructive pulmonary disease) ([place])    Depression    [employer]ia 07/27/2020   Diabetes mellitus without complication ([place])    Hyperlipidemia    [employer]ia 07/27/2020   Neuromuscular disorder ([place])    [employer]reesia 07/27/2020   Sleep apnea    [employer]reesia 07/27/2020    History reviewed. No pertinent surgical history.  History reviewed. No pertinent family history.  Social History   Socioeconomic History   Marital status: Married    Spouse name: Not on file   Number of children: Not on file   Years of education: Not on file   Highest  education level: Not on file  Occupational History   Not on file  Tobacco Use   Smoking status: Every Day    Packs/day: 1.00    Types: Cigarettes   Smokeless tobacco: Never   Tobacco comments:    smokes 1/2 pack per day 01/27/2021  Vaping Use   Vaping Use: Never used  Substance and Sexual Activity   Alcohol use: Yes   Drug use: Never   Sexual activity: Not on file  Other Topics Concern   Not on file  Social History Narrative   Not on file   Social Determinants of Health   Financial Resource Strain: Not on file  Food Insecurity: Not on file  Transportation Needs: Not on file  Physical Activity: Not on file  Stress: Not on file  Social Connections: Not on file  Intimate Partner Violence: Not on file    Outpatient Medications Prior to Visit  Medication Sig Dispense Refill   Accu-Chek FastClix Lancets MISC USE TO CHECK BLOOD SUGAR FOUR TIMES DAILY BEFORE MEALS AND AT BEDTIME 102 each 10   ACCU-CHEK GUIDE test strip USE TO TEST BLOOD SUGAR THREE TIMES DAILY 100 strip 10   albuterol (VENTOLIN HFA) 108 (90 Base) MCG/ACT inhaler Inhale 2 puffs into the lungs every 6 (six) hours as needed for wheezing or shortness of breath. 6.7 g 5   atomoxetine (STRATTERA) 40 MG capsule Take  40 mg by mouth every morning.     B-D INS SYR MICROFINE 1CC/28G 28G X 1/2" 1 ML MISC USE WITH INSULIN THREE TIMES DAILY BEFORE MEALS 100 each 0   Blood Glucose Monitoring Suppl (ACCU-CHEK COMPACT CARE KIT) KIT 1 kit by Does not apply route 4 (four) times daily -  before meals and at bedtime. 1 kit 0   colchicine 0.6 MG tablet Take 2 tablets once followed by 1 tablet 1 hour later. Then, take 1 tablet by mouth once daily until resolution of pain. 10 tablet 0   fluticasone (FLOVENT HFA) 110 MCG/ACT inhaler Inhale 2 puffs into the lungs 2 (two) times daily. 1 each 5   glipiZIDE (GLUCOTROL) 10 MG tablet TAKE 1 TABLET BY MOUTH TWICE DAILY BEFORE a meal 60 tablet 3   HUMULIN 70/30 (70-30) 100 UNIT/ML injection INJECT  30 UNITS INTO THE SKIN THREE TIMES DAILY BEFORE MEALS 20 mL 7   Insulin Syringe 27G X 1/2" 1 ML MISC Inject 10 Units into the skin 3 (three) times daily before meals. 200 each 3   lamoTRIgine (LAMICTAL) 100 MG tablet Take 100 mg by mouth 2 (two) times daily.     LANTUS 100 UNIT/ML injection INJECT 30 UNITS into THE SKIN DAILY 10 mL 2   lidocaine-prilocaine (EMLA) cream Apply 1 application topically as needed. 30 g 0   lithium carbonate (ESKALITH) 450 MG CR tablet Take 900 mg by mouth daily.     metFORMIN (GLUCOPHAGE) 500 MG tablet Take 1,000 mg by mouth 2 (two) times daily with a meal.     metFORMIN (GLUCOPHAGE-XR) 500 MG 24 hr tablet TAKE TWO TABLETS BY MOUTH TWICE DAILY 360 tablet 2   prazosin (MINIPRESS) 1 MG capsule Take 2 mg by mouth at bedtime.     propranolol (INDERAL) 20 MG tablet Take 20 mg by mouth 3 (three) times daily.     TRULICITY 1.5 EH/6.3JS SOPN INJECT 1.5MG INTO THE SKIN ONCE A WEEK 6 mL 2   ARIPiprazole (ABILIFY) 5 MG tablet Take 5 mg by mouth daily. (Patient not taking: Reported on 06/17/2021)     No facility-administered medications prior to visit.    No Known Allergies  ROS Review of Systems  Constitutional:  Negative for chills and fever.  HENT:  Negative for congestion and sore throat.   Eyes:  Negative for pain and discharge.  Respiratory:  Negative for cough and shortness of breath.   Cardiovascular:  Negative for chest pain and palpitations.  Gastrointestinal:  Negative for constipation, diarrhea, nausea and vomiting.  Endocrine: Negative for polydipsia and polyuria.  Genitourinary:  Negative for dysuria and hematuria.  Musculoskeletal:  Positive for arthralgias and back pain. Negative for neck pain and neck stiffness.       Right shoulder pain - better now  Skin:  Negative for wound.  Neurological:  Negative for dizziness, weakness, numbness and headaches.  Psychiatric/Behavioral:  Negative for agitation and behavioral problems.      Objective:     Physical Exam Vitals reviewed.  Constitutional:      General: He is not in acute distress.    Appearance: He is obese. He is not diaphoretic.  HENT:     Head: Normocephalic and atraumatic.     Nose: Nose normal.     Mouth/Throat:     Mouth: Mucous membranes are moist.  Eyes:     General: No scleral icterus.    Extraocular Movements: Extraocular movements intact.  Cardiovascular:     Rate  and Rhythm: Normal rate and regular rhythm.     Pulses: Normal pulses.     Heart sounds: Normal heart sounds. No murmur heard. Pulmonary:     Breath sounds: Normal breath sounds. No wheezing or rales.  Abdominal:     Palpations: Abdomen is soft.     Tenderness: There is no abdominal tenderness.  Musculoskeletal:        General: Tenderness (Lumbar spine area, paraspinal tenderness on left side) present.     Right shoulder: No swelling, effusion or bony tenderness. Normal range of motion.     Left shoulder: No swelling, effusion or bony tenderness. Normal range of motion.     Cervical back: Neck supple. No tenderness.     Right lower leg: No edema.     Left lower leg: No edema.  Skin:    General: Skin is warm.  Neurological:     General: No focal deficit present.     Mental Status: He is alert and oriented to person, place, and time.     Sensory: No sensory deficit.     Motor: No weakness.  Psychiatric:        Mood and Affect: Mood normal.        Behavior: Behavior normal.    BP 128/81 (BP Location: Left Arm, Patient Position: Sitting, Cuff Size: Normal)   Pulse 67   Temp 98.4 F (36.9 C) (Oral)   Resp 18   Ht _0  (1.727 m)   Wt 284 lb 0.6 oz (128.8 kg)   SpO2 96%   BMI 43.19 kg/m  Wt Readings from Last 3 Encounters:  06/17/21 284 lb 0.6 oz (128.8 kg)  03/11/21 300 lb (136.1 kg)  01/27/21 (!) 302 lb 6.4 oz (137.2 kg)     Health Maintenance Due  Topic Date Due   PNEUMOCOCCAL POLYSACCHARIDE VACCINE AGE 10-64 HIGH RISK  Never done   Pneumococcal Vaccine 16-40 Years old (1 -  PCV) Never done   HIV Screening  Never done   Hepatitis C Screening  Never done   TETANUS/TDAP  Never done   FOOT EXAM  09/17/2020   HEMOGLOBIN A1C  01/27/2021   INFLUENZA VACCINE  06/01/2021    There are no preventive care reminders to display for this patient.  Lab Results  Component Value Date   TSH 6.680 (H) 09/17/2020   Lab Results  Component Value Date   WBC 10.1 09/17/2020   HGB 16.2 09/17/2020   HCT 46.2 09/17/2020   MCV 95 09/17/2020   PLT 291 09/17/2020   Lab Results  Component Value Date   NA 141 09/17/2020   K 4.5 09/17/2020   CO2 26 09/17/2020   GLUCOSE 93 09/17/2020   BUN 12 09/17/2020   CREATININE 0.93 09/17/2020   BILITOT 0.4 09/17/2020   ALKPHOS 87 09/17/2020   AST 14 09/17/2020   ALT 20 09/17/2020   PROT 7.0 09/17/2020   ALBUMIN 4.4 09/17/2020   CALCIUM 9.6 09/17/2020   ANIONGAP 12 09/07/2019   Lab Results  Component Value Date   CHOL 151 09/17/2020   Lab Results  Component Value Date   HDL 34 (L) 09/17/2020   Lab Results  Component Value Date   LDLCALC 89 09/17/2020   Lab Results  Component Value Date   TRIG 157 (H) 09/17/2020   Lab Results  Component Value Date   CHOLHDL 4.4 09/17/2020   Lab Results  Component Value Date   HGBA1C 9.4 (A) 07/30/2020   HGBA1C 9.4 07/30/2020  HGBA1C 9.4 (A) 07/30/2020   HGBA1C 9.4 (A) 07/30/2020      Assessment & Plan:   Problem List Items Addressed This Visit       Respiratory   Sleep apnea    Had sleep study recently Has not received CPAP device yet F/u Pulmonology      COPD (chronic obstructive pulmonary disease) ([facility])    Well-controlled On Flovent and Ventolin inhaler PRN        Endocrine   Diabetes mellitus without complication ([facility])    ZWC5E: 9.4, needs updated blood tests Home blood glucose readings better now with   On Metformin 500 mg BID and Glipizide 10 mg BID  Continue Lantus 30 U qHS Blood glucose around 100s at home most of the time, according to the  patient Continue Trulicity to 1.5 mg every week Advised to comply with the treatment Advised to follow diabetic diet F/u CMP and lipid panel         Nervous and Auditory   Chronic bilateral low back pain with bilateral sciatica - Primary    Check X-ray lumbar spine Would start Gabapentin after checking with Psychiatrist about other medications Avoid heavy lifting Heating pad and/or ice Tylenol PRN      Relevant Orders   DG Lumbar Spine Complete     Other   Bipolar disorder ([place])    On Lithium, Abilify and Lamictal Takes Atomoxetine for resistant depression Follows up with Psychiatrist - needs to schedule appointment      Tobacco abuse    Asked about quitting: confirms that he currently smokes cigarettes Advise to quit smoking: Educated about QUITTING to reduce the risk of cancer, cardio and cerebrovascular disease. Assess willingness: Unwilling to quit or cut down at this time Assist with counseling and pharmacotherapy: Counseled for 5 minutes and literature provided. Arrange for follow up: Follow up and continue to offer help.       No orders of the defined types were placed in this encounter.   Follow-up: Return in about 4 months (around 10/17/2021) for Annual physical.    Lindell Spar, MD

## 2021-06-17 NOTE — Assessment & Plan Note (Signed)
Asked about quitting: confirms that he currently smokes cigarettes Advise to quit smoking: Educated about QUITTING to reduce the risk of cancer, cardio and cerebrovascular disease. Assess willingness: Unwilling to quit or cut down at this time Assist with counseling and pharmacotherapy: Counseled for 5 minutes and literature provided. Arrange for follow up: Follow up and continue to offer help. 

## 2021-06-17 NOTE — Assessment & Plan Note (Signed)
Well-controlled °On Flovent and Ventolin inhaler PRN °

## 2021-06-17 NOTE — Assessment & Plan Note (Signed)
On Lithium, Abilify and Lamictal Takes Atomoxetine for resistant depression Follows up with Psychiatrist - needs to schedule appointment

## 2021-06-18 LAB — CMP14+EGFR
ALT: 31 IU/L (ref 0–44)
AST: 25 IU/L (ref 0–40)
Albumin/Globulin Ratio: 2 (ref 1.2–2.2)
Albumin: 4.4 g/dL (ref 4.0–5.0)
Alkaline Phosphatase: 86 IU/L (ref 44–121)
BUN/Creatinine Ratio: 13 (ref 9–20)
BUN: 11 mg/dL (ref 6–24)
Bilirubin Total: 0.5 mg/dL (ref 0.0–1.2)
CO2: 21 mmol/L (ref 20–29)
Calcium: 9.4 mg/dL (ref 8.7–10.2)
Chloride: 100 mmol/L (ref 96–106)
Creatinine, Ser: 0.85 mg/dL (ref 0.76–1.27)
Globulin, Total: 2.2 g/dL (ref 1.5–4.5)
Glucose: 165 mg/dL — ABNORMAL HIGH (ref 65–99)
Potassium: 4.5 mmol/L (ref 3.5–5.2)
Sodium: 136 mmol/L (ref 134–144)
Total Protein: 6.6 g/dL (ref 6.0–8.5)
eGFR: 113 mL/min/{1.73_m2} (ref >59)

## 2021-06-18 LAB — HEPATITIS C ANTIBODY: Hep C Virus Ab: <0.1 s/co ratio (ref 0.0–0.9)

## 2021-06-18 LAB — CBC
Hematocrit: 41.9 % (ref 37.5–51.0)
Hemoglobin: 15.1 g/dL (ref 13.0–17.7)
MCH: 34.1 pg — ABNORMAL HIGH (ref 26.6–33.0)
MCHC: 36 g/dL — ABNORMAL HIGH (ref 31.5–35.7)
MCV: 95 fL (ref 79–97)
Platelets: 260 10*3/uL (ref 150–450)
RBC: 4.43 x10E6/uL (ref 4.14–5.80)
RDW: 11.4 % — ABNORMAL LOW (ref 11.6–15.4)
WBC: 8.4 10*3/uL (ref 3.4–10.8)

## 2021-06-18 LAB — LIPID PANEL
Chol/HDL Ratio: 4.5 ratio (ref 0.0–5.0)
Cholesterol, Total: 136 mg/dL (ref 100–199)
HDL: 30 mg/dL — ABNORMAL LOW (ref >39)
LDL Chol Calc (NIH): 80 mg/dL (ref 0–99)
Triglycerides: 146 mg/dL (ref 0–149)
VLDL Cholesterol Cal: 26 mg/dL (ref 5–40)

## 2021-06-18 LAB — HIV ANTIBODY (ROUTINE TESTING W REFLEX): HIV Screen 4th Generation wRfx: NONREACTIVE

## 2021-06-18 LAB — HEMOGLOBIN A1C
Est. average glucose Bld gHb Est-mCnc: 148 mg/dL
Hgb A1c MFr Bld: 6.8 % — ABNORMAL HIGH (ref 4.8–5.6)

## 2021-06-18 LAB — TSH: TSH: 4.58 u[IU]/mL — ABNORMAL HIGH (ref 0.450–4.500)

## 2021-06-18 LAB — URIC ACID: Uric Acid: 7.3 mg/dL (ref 3.8–8.4)

## 2021-06-18 NOTE — Addendum Note (Signed)
Addended byTrena Platt on: 06/18/2021 08:27 AM   Modules accepted: Orders

## 2021-06-30 ENCOUNTER — Ambulatory Visit: Payer: Medicaid Other | Admitting: Orthopedic Surgery

## 2021-07-03 ENCOUNTER — Other Ambulatory Visit: Payer: Self-pay | Admitting: Internal Medicine

## 2021-07-17 ENCOUNTER — Ambulatory Visit: Payer: Medicaid Other | Admitting: Pulmonary Disease

## 2021-07-28 ENCOUNTER — Other Ambulatory Visit: Payer: Self-pay | Admitting: Internal Medicine

## 2021-07-28 DIAGNOSIS — E119 Type 2 diabetes mellitus without complications: Secondary | ICD-10-CM

## 2021-08-02 ENCOUNTER — Other Ambulatory Visit: Payer: Self-pay | Admitting: Internal Medicine

## 2021-08-13 ENCOUNTER — Telehealth: Payer: Medicaid Other | Admitting: Physician Assistant

## 2021-08-13 DIAGNOSIS — H66002 Acute suppurative otitis media without spontaneous rupture of ear drum, left ear: Secondary | ICD-10-CM

## 2021-08-13 MED ORDER — NEOMYCIN-POLYMYXIN-HC 3.5-10000-1 OT SOLN: 3.0000 [drp] | Freq: Four times a day (QID) | OTIC | 0 refills | Status: DC

## 2021-08-13 NOTE — Progress Notes (Signed)
Virtual Visit Consent   Isaac Weiss, you are scheduled for a virtual visit with a [facility] provider today.     Just as with appointments in the office, your consent must be obtained to participate.  Your consent will be active for this visit and any virtual visit you may have with one of our providers in the next 365 days.     If you have a MyChart account, a copy of this consent can be sent to you electronically.  All virtual visits are billed to your insurance company just like a traditional visit in the office.    As this is a virtual visit, video technology does not allow for your provider to perform a traditional examination.  This may limit your provider's ability to fully assess your condition.  If your provider identifies any concerns that need to be evaluated in person or the need to arrange testing (such as labs, EKG, etc.), we will make arrangements to do so.     Although advances in technology are sophisticated, we cannot ensure that it will always work on either your end or our end.  If the connection with a video visit is poor, the visit may have to be switched to a telephone visit.  With either a video or telephone visit, we are not always able to ensure that we have a secure connection.     I need to obtain your verbal consent now.   Are you willing to proceed with your visit today?    Bee Hammerschmidt has provided verbal consent on 08/13/2021 for a virtual visit (video or telephone).   Mar Daring, PA-C   Date: 08/13/2021 9:18 AM   Virtual Visit via Video Note   I, Mar Daring, connected with  Isaac Weiss  ([id number], 1981-05-13) on 08/13/21 at  9:15 AM EDT by a video-enabled telemedicine application and verified that I am speaking with the correct person using two identifiers.  Location: Patient: Virtual Visit Location Patient: Home Provider: Virtual Visit Location Provider: Home Office   I discussed the limitations of evaluation and  management by telemedicine and the availability of in person appointments. The patient expressed understanding and agreed to proceed.    History of Present Illness: Isaac Weiss is a 40 y.o. who identifies as a male who was assigned male at birth, and is being seen today for left ear pain. Other family members have been sick as well. Symptoms started over a week ago, ear pain started a few days ago. Ear feels hot and feverish, denies systemic fevers. Reports left side of face is hurting, hurts to open jaw. Nausea, dizziness, vomiting. Other URI symptoms with congestion and cough have resolved. Did go to [facility] ER last night and was tested for covid, which was negative. Left without being seen due to wait times.    Problems:  Patient Active Problem List   Diagnosis Date Noted   Chronic bilateral low back pain with bilateral sciatica 06/17/2021   Erectile dysfunction 12/18/2020   Tendinosis of right shoulder 11/19/2020   H/O cold sores 09/08/2020   Bipolar disorder ([place]) 07/30/2020   Tobacco abuse 07/30/2020   Morbid obesity ([place]) 07/30/2020   Shoulder pain, right 11/26/2019   Depression with anxiety 11/26/2019   Sleep apnea 09/12/2019   Diabetes mellitus without complication ([place])    Asthma    COPD (chronic obstructive pulmonary disease) ([place])    Schizoaffective disorder ([place]) 12/16/2006    Allergies: No Known Allergies Medications:  Current  Outpatient Medications:    neomycin-polymyxin-hydrocortisone (CORTISPORIN) OTIC solution, Place 3 drops into the left ear 4 (four) times daily. X 5-7 days, Disp: 10 mL, Rfl: 0   Accu-Chek FastClix Lancets MISC, USE TO CHECK BLOOD SUGAR FOUR TIMES DAILY BEFORE MEALS AND AT BEDTIME, Disp: 102 each, Rfl: 10   ACCU-CHEK GUIDE test strip, USE TO TEST BLOOD SUGAR THREE TIMES DAILY, Disp: 100 strip, Rfl: 10   albuterol (VENTOLIN HFA) 108 (90 Base) MCG/ACT inhaler, Inhale 2 puffs into the lungs every 6 (six) hours as needed for wheezing or shortness  of breath., Disp: 6.7 g, Rfl: 5   ARIPiprazole (ABILIFY) 5 MG tablet, Take 5 mg by mouth daily. (Patient not taking: Reported on 06/17/2021), Disp: , Rfl:    atomoxetine (STRATTERA) 40 MG capsule, Take 40 mg by mouth every morning., Disp: , Rfl:    B-D INS SYR MICROFINE 1CC/28G 28G X 1/2" 1 ML MISC, USE WITH INSULIN THREE TIMES DAILY BEFORE MEALS, Disp: 100 each, Rfl: 5   Blood Glucose Monitoring Suppl (ACCU-CHEK COMPACT CARE KIT) KIT, 1 kit by Does not apply route 4 (four) times daily -  before meals and at bedtime., Disp: 1 kit, Rfl: 0   colchicine 0.6 MG tablet, Take 2 tablets once followed by 1 tablet 1 hour later. Then, take 1 tablet by mouth once daily until resolution of pain., Disp: 10 tablet, Rfl: 0   fluticasone (FLOVENT HFA) 110 MCG/ACT inhaler, Inhale 2 puffs into the lungs 2 (two) times daily., Disp: 1 each, Rfl: 5   glipiZIDE (GLUCOTROL) 10 MG tablet, TAKE 1 TABLET BY MOUTH TWICE DAILY BEFORE a meal, Disp: 60 tablet, Rfl: 3   HUMULIN 70/30 (70-30) 100 UNIT/ML injection, INJECT 30 UNITS INTO THE SKIN THREE TIMES DAILY BEFORE MEALS, Disp: 20 mL, Rfl: 7   Insulin Syringe 27G X 1/2" 1 ML MISC, Inject 10 Units into the skin 3 (three) times daily before meals., Disp: 200 each, Rfl: 3   lamoTRIgine (LAMICTAL) 100 MG tablet, Take 100 mg by mouth 2 (two) times daily., Disp: , Rfl:    LANTUS 100 UNIT/ML injection, INJECT 30 UNITS into THE SKIN DAILY, Disp: 10 mL, Rfl: 2   lidocaine-prilocaine (EMLA) cream, Apply 1 application topically as needed., Disp: 30 g, Rfl: 0   lithium carbonate (ESKALITH) 450 MG CR tablet, Take 900 mg by mouth daily., Disp: , Rfl:    metFORMIN (GLUCOPHAGE) 500 MG tablet, Take 1,000 mg by mouth 2 (two) times daily with a meal., Disp: , Rfl:    metFORMIN (GLUCOPHAGE-XR) 500 MG 24 hr tablet, TAKE TWO TABLETS BY MOUTH TWICE DAILY, Disp: 360 tablet, Rfl: 2   prazosin (MINIPRESS) 1 MG capsule, Take 2 mg by mouth at bedtime., Disp: , Rfl:    propranolol (INDERAL) 20 MG tablet,  Take 20 mg by mouth 3 (three) times daily., Disp: , Rfl:    TRULICITY 1.5 ML/4.6TK SOPN, INJECT 1.5MG INTO THE SKIN ONCE A WEEK, Disp: 6 mL, Rfl: 2  Observations/Objective: Patient is well-developed, well-nourished in no acute distress.  Resting comfortably at home.  Head is normocephalic, atraumatic.  No labored breathing.  Speech is clear and coherent with logical content.  Patient is alert and oriented at baseline.    Assessment and Plan: 1. Non-recurrent acute suppurative otitis media of left ear without spontaneous rupture of tympanic membrane - neomycin-polymyxin-hydrocortisone (CORTISPORIN) OTIC solution; Place 3 drops into the left ear 4 (four) times daily. X 5-7 days  Dispense: 10 mL; Refill: 0  -  Worsening symptoms of ear pain, with resolution of other URI symptoms; suspect secondary otitis media - Cortisporin prescribed - Tylenol and ibuprofen PRN for pain - Warm compresses - Push fluids - Seek in person evaluation if not improving or if symptoms worsen in the meantime  Follow Up Instructions: I discussed the assessment and treatment plan with the patient. The patient was provided an opportunity to ask questions and all were answered. The patient agreed with the plan and demonstrated an understanding of the instructions.  A copy of instructions were sent to the patient via MyChart unless otherwise noted below.    The patient was advised to call back or seek an in-person evaluation if the symptoms worsen or if the condition fails to improve as anticipated.  Time:  I spent 10 minutes with the patient via telehealth technology discussing the above problems/concerns.    Mar Daring, PA-C

## 2021-08-13 NOTE — Patient Instructions (Signed)
Isaac Weiss, thank you for joining Mar Daring, PA-C for today's virtual visit.  While this provider is not your primary care provider (PCP), if your PCP is located in our provider database this encounter information will be shared with them immediately following your visit.  Consent: (Patient) Isaac Weiss provided verbal consent for this virtual visit at the beginning of the encounter.  Current Medications:  Current Outpatient Medications:    neomycin-polymyxin-hydrocortisone (CORTISPORIN) OTIC solution, Place 3 drops into the left ear 4 (four) times daily. X 5-7 days, Disp: 10 mL, Rfl: 0   Accu-Chek FastClix Lancets MISC, USE TO CHECK BLOOD SUGAR FOUR TIMES DAILY BEFORE MEALS AND AT BEDTIME, Disp: 102 each, Rfl: 10   ACCU-CHEK GUIDE test strip, USE TO TEST BLOOD SUGAR THREE TIMES DAILY, Disp: 100 strip, Rfl: 10   albuterol (VENTOLIN HFA) 108 (90 Base) MCG/ACT inhaler, Inhale 2 puffs into the lungs every 6 (six) hours as needed for wheezing or shortness of breath., Disp: 6.7 g, Rfl: 5   ARIPiprazole (ABILIFY) 5 MG tablet, Take 5 mg by mouth daily. (Patient not taking: Reported on 06/17/2021), Disp: , Rfl:    atomoxetine (STRATTERA) 40 MG capsule, Take 40 mg by mouth every morning., Disp: , Rfl:    B-D INS SYR MICROFINE 1CC/28G 28G X 1/2" 1 ML MISC, USE WITH INSULIN THREE TIMES DAILY BEFORE MEALS, Disp: 100 each, Rfl: 5   Blood Glucose Monitoring Suppl (ACCU-CHEK COMPACT CARE KIT) KIT, 1 kit by Does not apply route 4 (four) times daily -  before meals and at bedtime., Disp: 1 kit, Rfl: 0   colchicine 0.6 MG tablet, Take 2 tablets once followed by 1 tablet 1 hour later. Then, take 1 tablet by mouth once daily until resolution of pain., Disp: 10 tablet, Rfl: 0   fluticasone (FLOVENT HFA) 110 MCG/ACT inhaler, Inhale 2 puffs into the lungs 2 (two) times daily., Disp: 1 each, Rfl: 5   glipiZIDE (GLUCOTROL) 10 MG tablet, TAKE 1 TABLET BY MOUTH TWICE DAILY BEFORE a meal, Disp: 60  tablet, Rfl: 3   HUMULIN 70/30 (70-30) 100 UNIT/ML injection, INJECT 30 UNITS INTO THE SKIN THREE TIMES DAILY BEFORE MEALS, Disp: 20 mL, Rfl: 7   Insulin Syringe 27G X 1/2" 1 ML MISC, Inject 10 Units into the skin 3 (three) times daily before meals., Disp: 200 each, Rfl: 3   lamoTRIgine (LAMICTAL) 100 MG tablet, Take 100 mg by mouth 2 (two) times daily., Disp: , Rfl:    LANTUS 100 UNIT/ML injection, INJECT 30 UNITS into THE SKIN DAILY, Disp: 10 mL, Rfl: 2   lidocaine-prilocaine (EMLA) cream, Apply 1 application topically as needed., Disp: 30 g, Rfl: 0   lithium carbonate (ESKALITH) 450 MG CR tablet, Take 900 mg by mouth daily., Disp: , Rfl:    metFORMIN (GLUCOPHAGE) 500 MG tablet, Take 1,000 mg by mouth 2 (two) times daily with a meal., Disp: , Rfl:    metFORMIN (GLUCOPHAGE-XR) 500 MG 24 hr tablet, TAKE TWO TABLETS BY MOUTH TWICE DAILY, Disp: 360 tablet, Rfl: 2   prazosin (MINIPRESS) 1 MG capsule, Take 2 mg by mouth at bedtime., Disp: , Rfl:    propranolol (INDERAL) 20 MG tablet, Take 20 mg by mouth 3 (three) times daily., Disp: , Rfl:    TRULICITY 1.5 QJ/1.9ER SOPN, INJECT 1.5MG INTO THE SKIN ONCE A WEEK, Disp: 6 mL, Rfl: 2   Medications ordered in this encounter:  Meds ordered this encounter  Medications   neomycin-polymyxin-hydrocortisone (CORTISPORIN) OTIC solution  Sig: Place 3 drops into the left ear 4 (four) times daily. X 5-7 days    Dispense:  10 mL    Refill:  0    Order Specific Question:   Supervising Provider    Answer:   Sabra Heck, BRIAN [[id number]]     *If you need refills on other medications prior to your next appointment, please contact your pharmacy*  Follow-Up: Call back or seek an in-person evaluation if the symptoms worsen or if the condition fails to improve as anticipated.  Other Instructions Otitis Media, Adult Otitis media is a condition in which the middle ear is red and swollen (inflamed) and full of fluid. The middle ear is the part of the ear that contains bones  for hearing as well as air that helps send sounds to the brain. The condition usually goes away on its own. What are the causes? This condition is caused by a blockage in the eustachian tube. This tube connects the middle ear to the back of the nose. It normally allows air into the middle ear. The blockage is caused by fluid or swelling. Problems that can cause blockage include: A cold or infection that affects the nose, mouth, or throat. Allergies. An irritant, such as tobacco smoke. Adenoids that have become large. The adenoids are soft tissue located in the back of the throat, behind the nose and the roof of the mouth. Growth or swelling in the upper part of the throat, just behind the nose (nasopharynx). Damage to the ear caused by a change in pressure. This is called barotrauma. What increases the risk? You are more likely to develop this condition if you: Smoke or are exposed to tobacco smoke. Have an opening in the roof of your mouth (cleft palate). Have acid reflux. Have problems in your body's defense system (immune system). What are the signs or symptoms? Symptoms of this condition include: Ear pain. Fever. Problems with hearing. Being tired. Fluid leaking from the ear. Ringing in the ear. How is this treated? This condition can go away on its own within 3-5 days. But if the condition is caused by germs (bacteria) and does not go away on its own, or if it keeps coming back, your doctor may: Give you antibiotic medicines. Give you medicines for pain. Follow these instructions at home: Take over-the-counter and prescription medicines only as told by your doctor. If you were prescribed an antibiotic medicine, take it as told by your doctor. Do not stop taking it even if you start to feel better. Keep all follow-up visits. Contact a doctor if: You have bleeding from your nose. There is a lump on your neck. You are not feeling better in 5 days. You feel worse instead of  better. Get help right away if: You have pain that is not helped with medicine. You have swelling, redness, or pain around your ear. You get a stiff neck. You cannot move part of your face (paralysis). You notice that the bone behind your ear hurts when you touch it. You get a very bad headache. Summary Otitis media means that the middle ear is red, swollen, and full of fluid. This condition usually goes away on its own. If the problem does not go away, treatment may be needed. You may be given medicines to treat the infection or to treat your pain. If you were prescribed an antibiotic medicine, take it as told by your doctor. Do not stop taking it even if you start to feel better. Keep  all follow-up visits. This information is not intended to replace advice given to you by your health care provider. Make sure you discuss any questions you have with your health care provider. Document Revised: 01/26/2021 Document Reviewed: 01/26/2021 Elsevier Patient Education  2022 [employer].    If you have been instructed to have an in-person evaluation today at a local Urgent Care facility, please use the link below. It will take you to a list of all of our available [facility], including address, phone number and hours of operation. Please do not delay care.  [facility]  If you or a family member do not have a primary care provider, use the link below to schedule a visit and establish care. When you choose a [facility] primary care physician or advanced practice provider, you gain a long-term partner in health. Find a Primary Care Provider  Learn more about 's in-office and virtual care options: [facility]

## 2021-08-23 ENCOUNTER — Other Ambulatory Visit: Payer: Self-pay | Admitting: Internal Medicine

## 2021-08-23 DIAGNOSIS — E119 Type 2 diabetes mellitus without complications: Secondary | ICD-10-CM

## 2021-08-31 ENCOUNTER — Other Ambulatory Visit: Payer: Self-pay | Admitting: Pulmonary Disease

## 2021-08-31 ENCOUNTER — Other Ambulatory Visit: Payer: Self-pay | Admitting: Internal Medicine

## 2021-08-31 DIAGNOSIS — J411 Mucopurulent chronic bronchitis: Secondary | ICD-10-CM

## 2021-08-31 DIAGNOSIS — E119 Type 2 diabetes mellitus without complications: Secondary | ICD-10-CM

## 2021-09-14 ENCOUNTER — Telehealth: Payer: Medicaid Other | Admitting: Emergency Medicine

## 2021-09-14 ENCOUNTER — Ambulatory Visit: Payer: Medicaid Other | Admitting: Pulmonary Disease

## 2021-09-14 NOTE — Progress Notes (Signed)
Based on questionnaire responses, I do not believe pt has jock itch. I cannot determine best course of care via evisit. I have advised pt to seek video visit or in-person care.   Rica Mast, PhD, FNP-BC

## 2021-09-17 ENCOUNTER — Telehealth: Payer: Self-pay | Admitting: Pulmonary Disease

## 2021-09-17 NOTE — Telephone Encounter (Signed)
Called and spoke to Tan(?) from [employer]. She states to disregard request everything needed was sent in yesterday by [facility].

## 2021-09-21 ENCOUNTER — Encounter: Payer: Self-pay | Admitting: Internal Medicine

## 2021-09-21 ENCOUNTER — Other Ambulatory Visit: Payer: Self-pay

## 2021-09-21 ENCOUNTER — Telehealth (INDEPENDENT_AMBULATORY_CARE_PROVIDER_SITE_OTHER): Payer: Medicaid Other | Admitting: Internal Medicine

## 2021-09-21 DIAGNOSIS — L0882 Omphalitis not of newborn: Secondary | ICD-10-CM

## 2021-09-21 MED ORDER — CLOTRIMAZOLE-BETAMETHASONE 1-0.05 % EX CREA: 1.0000 "application " | TOPICAL_CREAM | Freq: Every day | CUTANEOUS | 0 refills | Status: DC

## 2021-09-21 NOTE — Progress Notes (Addendum)
Virtual Visit via Telephone Note   This visit type was conducted due to national recommendations for restrictions regarding the COVID-19 Pandemic (e.g. social distancing) in an effort to limit this patient's exposure and mitigate transmission in our community.  Due to his co-morbid illnesses, this patient is at least at moderate risk for complications without adequate follow up.  This format is felt to be most appropriate for this patient at this time.  The patient did not have access to video technology/had technical difficulties with video requiring transitioning to audio format only (telephone).  All issues noted in this document were discussed and addressed.  No physical exam could be performed with this format.  Evaluation Performed:  Follow-up visit  Date:  09/21/2021   ID:  Paymon Rosensteel, DOB [birth year], MRN [id number]  Patient Location: Home Provider Location: Office/Clinic  Participants: Patient Location of Patient: Home Location of Provider: Telehealth Consent was obtain for visit to be over via telehealth. I verified that I am speaking with the correct person using two identifiers.  PCP:  Lindell Spar, MD   Chief Complaint: Umbilical discharge  History of Present Illness:    Jobany Montellano is a 40 y.o. male who has a televisit for c/o umbilical discharge, which is foul smelling and yellowish in color.  He denies any pain around the umbilical area currently.  Denies any recent fever or chills.  The patient does not have symptoms concerning for COVID-19 infection (fever, chills, cough, or new shortness of breath).   Past Medical, Surgical, Social History, Allergies, and Medications have been Reviewed.  Past Medical History:  Diagnosis Date   Anxiety    Phreesia 07/27/2020   Arthritis    Phreesia 07/27/2020   Asthma    COPD (chronic obstructive pulmonary disease) ([place])    Depression    Phreesia 07/27/2020   Diabetes mellitus without complication ([place])     Hyperlipidemia    Phreesia 07/27/2020   Neuromuscular disorder ([place])    Phreesia 07/27/2020   Sleep apnea    Phreesia 07/27/2020   History reviewed. No pertinent surgical history.   Current Meds  Medication Sig   Accu-Chek FastClix Lancets MISC USE TO CHECK BLOOD SUGAR FOUR TIMES DAILY BEFORE MEALS AND AT BEDTIME   ACCU-CHEK GUIDE test strip USE TO TEST BLOOD SUGAR THREE TIMES DAILY   albuterol (VENTOLIN HFA) 108 (90 Base) MCG/ACT inhaler Inhale 2 puffs into the lungs every 6 (six) hours as needed for wheezing or shortness of breath.   ARIPiprazole (ABILIFY) 5 MG tablet Take 5 mg by mouth daily.   atomoxetine (STRATTERA) 40 MG capsule Take 40 mg by mouth every morning.   B-D INS SYR MICROFINE 1CC/28G 28G X 1/2" 1 ML MISC USE WITH INSULIN THREE TIMES DAILY BEFORE MEALS   Blood Glucose Monitoring Suppl (ACCU-CHEK COMPACT CARE KIT) KIT 1 kit by Does not apply route 4 (four) times daily -  before meals and at bedtime.   clotrimazole-betamethasone (LOTRISONE) cream Apply 1 application topically daily.   colchicine 0.6 MG tablet Take 2 tablets once followed by 1 tablet 1 hour later. Then, take 1 tablet by mouth once daily until resolution of pain.   FLOVENT HFA 110 MCG/ACT inhaler INHALE TWO PUFFS BY MOUTH TWICE DAILY   glipiZIDE (GLUCOTROL) 10 MG tablet TAKE 1 TABLET BY MOUTH TWICE DAILY BEFORE a meal   HUMULIN 70/30 (70-30) 100 UNIT/ML injection INJECT 30 UNITS INTO THE SKIN THREE TIMES DAILY BEFORE MEALS   Insulin Syringe 27G  X 1/2" 1 ML MISC Inject 10 Units into the skin 3 (three) times daily before meals.   lamoTRIgine (LAMICTAL) 100 MG tablet Take 100 mg by mouth 2 (two) times daily.   LANTUS 100 UNIT/ML injection INJECT 30 UNITS into THE SKIN DAILY   lithium carbonate (ESKALITH) 450 MG CR tablet Take 900 mg by mouth daily.   metFORMIN (GLUCOPHAGE) 500 MG tablet Take 1,000 mg by mouth 2 (two) times daily with a meal.   metFORMIN (GLUCOPHAGE-XR) 500 MG 24 hr tablet TAKE TWO TABLETS BY  MOUTH TWICE DAILY   neomycin-polymyxin-hydrocortisone (CORTISPORIN) OTIC solution Place 3 drops into the left ear 4 (four) times daily. X 5-7 days   prazosin (MINIPRESS) 1 MG capsule Take 2 mg by mouth at bedtime.   propranolol (INDERAL) 20 MG tablet Take 20 mg by mouth 3 (three) times daily.   TRULICITY 1.5 DP/9.4LM SOPN INJECT 1.5MG INTO THE SKIN ONCE A WEEK     Allergies:   Patient has no known allergies.   ROS:   Please see the history of present illness.     All other systems reviewed and are negative.   Labs/Other Tests and Data Reviewed:    Recent Labs: 06/17/2021: ALT 31; BUN 11; Creatinine, Ser 0.85; Hemoglobin 15.1; Platelets 260; Potassium 4.5; Sodium 136; TSH 4.580   Recent Lipid Panel Lab Results  Component Value Date/Time   CHOL 136 06/17/2021 10:06 AM   TRIG 146 06/17/2021 10:06 AM   HDL 30 (L) 06/17/2021 10:06 AM   CHOLHDL 4.5 06/17/2021 10:06 AM   LDLCALC 80 06/17/2021 10:06 AM    Wt Readings from Last 3 Encounters:  06/17/21 284 lb 0.6 oz (128.8 kg)  03/11/21 300 lb (136.1 kg)  01/27/21 (!) 302 lb 6.4 oz (137.2 kg)     ASSESSMENT & PLAN:    Omphalitis in adult Clotrimazole cream started Advised to keep area clean and dry Obesity is a risk factor for omphalitis Advised to keep the umbilical area clean from fallen hair  Time:   Today, I have spent 9 minutes reviewing the chart, including problem list, medications, and with the patient with telehealth technology discussing the above problems.   Medication Adjustments/Labs and Tests Ordered: Current medicines are reviewed at length with the patient today.  Concerns regarding medicines are outlined above.   Tests Ordered: No orders of the defined types were placed in this encounter.   Medication Changes: Meds ordered this encounter  Medications   clotrimazole-betamethasone (LOTRISONE) cream    Sig: Apply 1 application topically daily.    Dispense:  30 g    Refill:  0     Note: This  dictation was prepared with Dragon dictation along with smaller phrase technology. Similar sounding words can be transcribed inadequately or may not be corrected upon review. Any transcriptional errors that result from this process are unintentional.      Disposition:  Follow up  Signed, Lindell Spar, MD  09/21/2021 4:13 PM     [facility]

## 2021-09-21 NOTE — Patient Instructions (Signed)
Please apply clotrimazole cream around umbilical area.  Try to keep area clean and dry.

## 2021-10-21 ENCOUNTER — Encounter: Payer: Self-pay | Admitting: Internal Medicine

## 2021-10-21 ENCOUNTER — Ambulatory Visit (INDEPENDENT_AMBULATORY_CARE_PROVIDER_SITE_OTHER): Payer: Medicaid Other | Admitting: Internal Medicine

## 2021-10-21 ENCOUNTER — Other Ambulatory Visit: Payer: Self-pay

## 2021-10-21 VITALS — BP 104/69 | HR 68 | Resp 17 | Ht 68.0 in | Wt 267.1 lb

## 2021-10-21 DIAGNOSIS — Z794 Long term (current) use of insulin: Secondary | ICD-10-CM

## 2021-10-21 DIAGNOSIS — J411 Mucopurulent chronic bronchitis: Secondary | ICD-10-CM

## 2021-10-21 DIAGNOSIS — E1169 Type 2 diabetes mellitus with other specified complication: Secondary | ICD-10-CM

## 2021-10-21 DIAGNOSIS — F3160 Bipolar disorder, current episode mixed, unspecified: Secondary | ICD-10-CM

## 2021-10-21 DIAGNOSIS — Z0001 Encounter for general adult medical examination with abnormal findings: Secondary | ICD-10-CM | POA: Insufficient documentation

## 2021-10-21 DIAGNOSIS — Z23 Encounter for immunization: Secondary | ICD-10-CM

## 2021-10-21 DIAGNOSIS — E038 Other specified hypothyroidism: Secondary | ICD-10-CM

## 2021-10-21 DIAGNOSIS — G4733 Obstructive sleep apnea (adult) (pediatric): Secondary | ICD-10-CM

## 2021-10-21 NOTE — Patient Instructions (Signed)
Please continue taking medications as prescribed.  Please continue low carb diet and perform moderate exercise/walking at least 150 mins/week.  You were given Flu vaccine in the office today.

## 2021-10-21 NOTE — Progress Notes (Signed)
Established Patient Office Visit  Subjective:  Patient ID: Isaac Weiss, male    DOB: 11-May-1981  Age: 40 y.o. MRN: [id number]  CC:  Chief Complaint  Patient presents with   Annual Exam    Pt states he has back pain for about 6 months    HPI Isaac Weiss is a 40 y.o. male with past medical history of diabetes mellitus, COPD, sleep apnea, bipolar disorder disorder, anxiety, PTSD, tobacco abuse and morbid obesity who presents for annual physical.  DM: His last HbA1C had improved to 6.8. He has been taking Lantus 30 U qHS and Trulicity. His blood glucose ranges around 100s most of the time. He also takes Metformin and Glipizide. He denies any polyuria or polyphagia.  He has lost almost 40 pounds since 02/22.  He has started seeing [facility] psychiatry for bipolar disorder.  He has been taking lithium, Lamictal and Vistaril for it.  He has been feeling better now.  He also takes Oncologist for ADD. Denies any SI or HI.  He has been following up with Dr. Halford Chessman for OSA.  He uses Flovent for COPD.  Denies any dyspnea or wheezing currently.  He received flu vaccine in the office today.  Past Medical History:  Diagnosis Date   Anxiety    Phreesia 07/27/2020   Arthritis    Phreesia 07/27/2020   Asthma    COPD (chronic obstructive pulmonary disease) ([place])    Depression    Phreesia 07/27/2020   Diabetes mellitus without complication ([place])    Hyperlipidemia    Phreesia 07/27/2020   Neuromuscular disorder ([place])    Phreesia 07/27/2020   Sleep apnea    Phreesia 07/27/2020    History reviewed. No pertinent surgical history.  History reviewed. No pertinent family history.  Social History   Socioeconomic History   Marital status: Married    Spouse name: Not on file   Number of children: Not on file   Years of education: Not on file   Highest education level: Not on file  Occupational History   Not on file  Tobacco Use   Smoking status: Every Day    Packs/day: 1.00     Types: Cigarettes   Smokeless tobacco: Never  Vaping Use   Vaping Use: Never used  Substance and Sexual Activity   Alcohol use: Yes   Drug use: Never   Sexual activity: Not on file  Other Topics Concern   Not on file  Social History Narrative   Not on file   Social Determinants of Health   Financial Resource Strain: Not on file  Food Insecurity: Not on file  Transportation Needs: Not on file  Physical Activity: Not on file  Stress: Not on file  Social Connections: Not on file  Intimate Partner Violence: Not on file    Outpatient Medications Prior to Visit  Medication Sig Dispense Refill   Accu-Chek FastClix Lancets MISC USE TO CHECK BLOOD SUGAR FOUR TIMES DAILY BEFORE MEALS AND AT BEDTIME 102 each 10   ACCU-CHEK GUIDE test strip USE TO TEST BLOOD SUGAR THREE TIMES DAILY 100 strip 10   albuterol (VENTOLIN HFA) 108 (90 Base) MCG/ACT inhaler Inhale 2 puffs into the lungs every 6 (six) hours as needed for wheezing or shortness of breath. 6.7 g 5   atomoxetine (STRATTERA) 40 MG capsule Take 40 mg by mouth every morning.     B-D INS SYR MICROFINE 1CC/28G 28G X 1/2" 1 ML MISC USE WITH INSULIN THREE TIMES DAILY BEFORE  MEALS 100 each 5   Blood Glucose Monitoring Suppl (ACCU-CHEK COMPACT CARE KIT) KIT 1 kit by Does not apply route 4 (four) times daily -  before meals and at bedtime. 1 kit 0   FLOVENT HFA 110 MCG/ACT inhaler INHALE TWO PUFFS BY MOUTH TWICE DAILY 12 g 2   glipiZIDE (GLUCOTROL) 10 MG tablet TAKE 1 TABLET BY MOUTH TWICE DAILY BEFORE a meal 60 tablet 3   HUMULIN 70/30 (70-30) 100 UNIT/ML injection INJECT 30 UNITS INTO THE SKIN THREE TIMES DAILY BEFORE MEALS 20 mL 7   hydrOXYzine (ATARAX) 25 MG tablet Take 25 mg by mouth 3 (three) times daily as needed.     Insulin Syringe 27G X 1/2" 1 ML MISC Inject 10 Units into the skin 3 (three) times daily before meals. 200 each 3   LANTUS 100 UNIT/ML injection INJECT 30 UNITS into THE SKIN DAILY 10 mL 2   lithium carbonate (ESKALITH) 450  MG CR tablet Take 900 mg by mouth daily.     lurasidone (LATUDA) 20 MG TABS tablet Take 20 mg by mouth daily.     metFORMIN (GLUCOPHAGE-XR) 500 MG 24 hr tablet TAKE TWO TABLETS BY MOUTH TWICE DAILY 694 tablet 2   TRULICITY 1.5 HW/3.8UE SOPN INJECT 1.5MG INTO THE SKIN ONCE A WEEK 6 mL 2   clotrimazole-betamethasone (LOTRISONE) cream Apply 1 application topically daily. (Patient not taking: Reported on 10/21/2021) 30 g 0   neomycin-polymyxin-hydrocortisone (CORTISPORIN) OTIC solution Place 3 drops into the left ear 4 (four) times daily. X 5-7 days (Patient not taking: Reported on 10/21/2021) 10 mL 0   ARIPiprazole (ABILIFY) 5 MG tablet Take 5 mg by mouth daily. (Patient not taking: Reported on 10/21/2021)     colchicine 0.6 MG tablet Take 2 tablets once followed by 1 tablet 1 hour later. Then, take 1 tablet by mouth once daily until resolution of pain. (Patient not taking: Reported on 10/21/2021) 10 tablet 0   lamoTRIgine (LAMICTAL) 100 MG tablet Take 100 mg by mouth 2 (two) times daily. (Patient not taking: Reported on 10/21/2021)     metFORMIN (GLUCOPHAGE) 500 MG tablet Take 1,000 mg by mouth 2 (two) times daily with a meal.     prazosin (MINIPRESS) 1 MG capsule Take 2 mg by mouth at bedtime. (Patient not taking: Reported on 10/21/2021)     propranolol (INDERAL) 20 MG tablet Take 20 mg by mouth 3 (three) times daily. (Patient not taking: Reported on 10/21/2021)     No facility-administered medications prior to visit.    No Known Allergies  ROS Review of Systems  Constitutional:  Negative for chills and fever.  HENT:  Negative for congestion and sore throat.   Eyes:  Negative for pain and discharge.  Respiratory:  Negative for cough and shortness of breath.   Cardiovascular:  Negative for chest pain and palpitations.  Gastrointestinal:  Negative for constipation, diarrhea, nausea and vomiting.  Endocrine: Negative for polydipsia and polyuria.  Genitourinary:  Negative for dysuria and  hematuria.  Musculoskeletal:  Positive for arthralgias and back pain. Negative for neck pain and neck stiffness.       Right shoulder pain - better now  Skin:  Negative for wound.  Neurological:  Negative for dizziness, weakness, numbness and headaches.  Psychiatric/Behavioral:  Negative for agitation and behavioral problems.      Objective:    Physical Exam Vitals reviewed.  Constitutional:      General: He is not in acute distress.    Appearance: He  is obese. He is not diaphoretic.  HENT:     Head: Normocephalic and atraumatic.     Nose: Nose normal.     Mouth/Throat:     Mouth: Mucous membranes are moist.  Eyes:     General: No scleral icterus.    Extraocular Movements: Extraocular movements intact.  Cardiovascular:     Rate and Rhythm: Normal rate and regular rhythm.     Pulses: Normal pulses.     Heart sounds: Normal heart sounds. No murmur heard. Pulmonary:     Breath sounds: Normal breath sounds. No wheezing or rales.  Abdominal:     Palpations: Abdomen is soft.     Tenderness: There is no abdominal tenderness.  Musculoskeletal:        General: Tenderness (Lumbar spine area, paraspinal tenderness on left side) present.     Right shoulder: No swelling, effusion or bony tenderness. Normal range of motion.     Left shoulder: No swelling, effusion or bony tenderness. Normal range of motion.     Cervical back: Neck supple. No tenderness.     Right lower leg: No edema.     Left lower leg: No edema.  Skin:    General: Skin is warm.  Neurological:     General: No focal deficit present.     Mental Status: He is alert and oriented to person, place, and time.     Sensory: No sensory deficit.     Motor: No weakness.  Psychiatric:        Mood and Affect: Mood normal.        Behavior: Behavior normal.    BP 104/69    Pulse 68    Resp 17    Ht 5' 8" (1.727 m)    Wt 267 lb 1.3 oz (121.1 kg)    SpO2 94%    BMI 40.61 kg/m  Wt Readings from Last 3 Encounters:  10/21/21 267  lb 1.3 oz (121.1 kg)  06/17/21 284 lb 0.6 oz (128.8 kg)  03/11/21 300 lb (136.1 kg)    Lab Results  Component Value Date   TSH 4.580 (H) 06/17/2021   Lab Results  Component Value Date   WBC 8.4 06/17/2021   HGB 15.1 06/17/2021   HCT 41.9 06/17/2021   MCV 95 06/17/2021   PLT 260 06/17/2021   Lab Results  Component Value Date   NA 136 06/17/2021   K 4.5 06/17/2021   CO2 21 06/17/2021   GLUCOSE 165 (H) 06/17/2021   BUN 11 06/17/2021   CREATININE 0.85 06/17/2021   BILITOT 0.5 06/17/2021   ALKPHOS 86 06/17/2021   AST 25 06/17/2021   ALT 31 06/17/2021   PROT 6.6 06/17/2021   ALBUMIN 4.4 06/17/2021   CALCIUM 9.4 06/17/2021   ANIONGAP 12 09/07/2019   EGFR 113 06/17/2021   Lab Results  Component Value Date   CHOL 136 06/17/2021   Lab Results  Component Value Date   HDL 30 (L) 06/17/2021   Lab Results  Component Value Date   LDLCALC 80 06/17/2021   Lab Results  Component Value Date   TRIG 146 06/17/2021   Lab Results  Component Value Date   CHOLHDL 4.5 06/17/2021   Lab Results  Component Value Date   HGBA1C 6.8 (H) 06/17/2021      Assessment & Plan:   Problem List Items Addressed This Visit       Respiratory   Sleep apnea    Followed by Dr. Halford Chessman Needs CPAP device  COPD (chronic obstructive pulmonary disease) (HCC)    Well-controlled On Flovent and Ventolin inhaler PRN        Endocrine   Type 2 diabetes mellitus with other specified complication (HCC)    IOX7D: 6.8, needs updated blood tests Home blood glucose readings better now with   On Metformin 500 mg BID and Glipizide 10 mg BID  Continue Lantus 30 U qHS Blood glucose around 100s at home most of the time, according to the patient Continue Trulicity to 1.5 mg every week, will adjust according to HbA1C Advised to comply with the treatment Advised to follow diabetic diet F/u CMP and lipid panel      Relevant Orders   Hemoglobin A1c   CMP14+EGFR     Other   Bipolar disorder  ([place])    Follows up with [facility] psychiatry On lithium, Lamictal and Vistaril Feels better now On Strattera for ADD      Morbid obesity ([place])    Has lost about 40 pounds since 02/22 Has been trying to follow low-carb diet On GLP 1 agonist for type 2 DM      Encounter for general adult medical examination with abnormal findings - Primary    Physical exam as documented. Counseling done  re healthy lifestyle involving commitment to 150 minutes exercise per week, heart healthy diet, and attaining healthy weight.The importance of adequate sleep also discussed. Changes in health habits are decided on by the patient with goals and time frames  set for achieving them. Immunization and cancer screening needs are specifically addressed at this visit.      Relevant Orders   CMP14+EGFR   CBC with Differential/Platelet   Other Visit Diagnoses     Subclinical hypothyroidism       Relevant Orders   TSH + free T4   Need for immunization against influenza       Relevant Orders   Flu Vaccine QUAD 5moIM (Fluarix, Fluzone & Alfiuria Quad PF) (Completed)       No orders of the defined types were placed in this encounter.   Follow-up: Return in about 4 months (around 02/19/2022) for DM and HTN.    RLindell Spar MD

## 2021-10-22 LAB — CBC WITH DIFFERENTIAL/PLATELET
Basophils Absolute: 0.1 10*3/uL (ref 0.0–0.2)
Basos: 1 %
EOS (ABSOLUTE): 0.4 10*3/uL (ref 0.0–0.4)
Eos: 5 %
Hematocrit: 48.4 % (ref 37.5–51.0)
Hemoglobin: 16.5 g/dL (ref 13.0–17.7)
Immature Grans (Abs): 0 10*3/uL (ref 0.0–0.1)
Immature Granulocytes: 0 %
Lymphocytes Absolute: 2.7 10*3/uL (ref 0.7–3.1)
Lymphs: 31 %
MCH: 33.2 pg — ABNORMAL HIGH (ref 26.6–33.0)
MCHC: 34.1 g/dL (ref 31.5–35.7)
MCV: 97 fL (ref 79–97)
Monocytes Absolute: 0.7 10*3/uL (ref 0.1–0.9)
Monocytes: 8 %
Neutrophils Absolute: 4.9 10*3/uL (ref 1.4–7.0)
Neutrophils: 55 %
Platelets: 292 10*3/uL (ref 150–450)
RBC: 4.97 x10E6/uL (ref 4.14–5.80)
RDW: 11.6 % (ref 11.6–15.4)
WBC: 8.8 10*3/uL (ref 3.4–10.8)

## 2021-10-22 LAB — TSH+FREE T4
Free T4: 1.22 ng/dL (ref 0.82–1.77)
TSH: 3.01 u[IU]/mL (ref 0.450–4.500)

## 2021-10-22 LAB — CMP14+EGFR
ALT: 24 IU/L (ref 0–44)
AST: 20 IU/L (ref 0–40)
Albumin/Globulin Ratio: 2.1 (ref 1.2–2.2)
Albumin: 4.4 g/dL (ref 4.0–5.0)
Alkaline Phosphatase: 88 IU/L (ref 44–121)
BUN/Creatinine Ratio: 10 (ref 9–20)
BUN: 9 mg/dL (ref 6–24)
Bilirubin Total: 0.3 mg/dL (ref 0.0–1.2)
CO2: 24 mmol/L (ref 20–29)
Calcium: 9 mg/dL (ref 8.7–10.2)
Chloride: 102 mmol/L (ref 96–106)
Creatinine, Ser: 0.92 mg/dL (ref 0.76–1.27)
Globulin, Total: 2.1 g/dL (ref 1.5–4.5)
Glucose: 164 mg/dL — ABNORMAL HIGH (ref 70–99)
Potassium: 4.6 mmol/L (ref 3.5–5.2)
Sodium: 139 mmol/L (ref 134–144)
Total Protein: 6.5 g/dL (ref 6.0–8.5)
eGFR: 108 mL/min/{1.73_m2} (ref >59)

## 2021-10-22 LAB — HEMOGLOBIN A1C
Est. average glucose Bld gHb Est-mCnc: 146 mg/dL
Hgb A1c MFr Bld: 6.7 % — ABNORMAL HIGH (ref 4.8–5.6)

## 2021-10-22 NOTE — Assessment & Plan Note (Signed)

## 2021-10-22 NOTE — Assessment & Plan Note (Signed)
Has lost about 40 pounds since 02/22 Has been trying to follow low-carb diet On GLP 1 agonist for type 2 DM

## 2021-10-22 NOTE — Assessment & Plan Note (Signed)
Well-controlled On Flovent and Ventolin inhaler PRN

## 2021-10-22 NOTE — Assessment & Plan Note (Signed)
Followed by Dr. Craige Cotta Needs CPAP device

## 2021-10-22 NOTE — Assessment & Plan Note (Signed)
Follows up with [facility] psychiatry On lithium, Lamictal and Vistaril Feels better now On Strattera for ADD

## 2021-10-22 NOTE — Assessment & Plan Note (Signed)
HbA1C: 6.8, needs updated blood tests Home blood glucose readings better now with   On Metformin 500 mg BID and Glipizide 10 mg BID  Continue Lantus 30 U qHS Blood glucose around 100s at home most of the time, according to the patient Continue Trulicity to 1.5 mg every week, will adjust according to HbA1C Advised to comply with the treatment Advised to follow diabetic diet F/u CMP and lipid panel

## 2021-10-25 ENCOUNTER — Other Ambulatory Visit: Payer: Self-pay | Admitting: Internal Medicine

## 2021-10-25 DIAGNOSIS — E119 Type 2 diabetes mellitus without complications: Secondary | ICD-10-CM

## 2021-10-27 ENCOUNTER — Other Ambulatory Visit: Payer: Self-pay | Admitting: Internal Medicine

## 2021-10-27 DIAGNOSIS — E119 Type 2 diabetes mellitus without complications: Secondary | ICD-10-CM

## 2021-11-01 ENCOUNTER — Other Ambulatory Visit: Payer: Self-pay | Admitting: Internal Medicine

## 2021-11-20 ENCOUNTER — Telehealth: Payer: Self-pay

## 2021-11-20 NOTE — Telephone Encounter (Signed)
Called and spoke to tan form [facility] about getting a DL. Tan states they have not given pt a cpap yet.

## 2021-11-23 ENCOUNTER — Telehealth: Payer: Self-pay

## 2021-11-23 ENCOUNTER — Ambulatory Visit: Payer: Medicaid Other | Admitting: Pulmonary Disease

## 2021-11-23 NOTE — Telephone Encounter (Signed)
Called [facility] to inquire about delay in patient receiving CPAP machine. No one was available to take call. Will reattempt to reach

## 2021-11-25 ENCOUNTER — Other Ambulatory Visit: Payer: Self-pay | Admitting: Pulmonary Disease

## 2021-11-25 DIAGNOSIS — J411 Mucopurulent chronic bronchitis: Secondary | ICD-10-CM

## 2021-11-30 ENCOUNTER — Other Ambulatory Visit: Payer: Self-pay | Admitting: Pulmonary Disease

## 2021-11-30 ENCOUNTER — Other Ambulatory Visit: Payer: Self-pay | Admitting: Internal Medicine

## 2021-11-30 DIAGNOSIS — J411 Mucopurulent chronic bronchitis: Secondary | ICD-10-CM

## 2021-12-28 ENCOUNTER — Other Ambulatory Visit: Payer: Self-pay | Admitting: Internal Medicine

## 2021-12-28 DIAGNOSIS — E119 Type 2 diabetes mellitus without complications: Secondary | ICD-10-CM

## 2022-01-03 ENCOUNTER — Other Ambulatory Visit: Payer: Self-pay | Admitting: Internal Medicine

## 2022-01-03 DIAGNOSIS — E119 Type 2 diabetes mellitus without complications: Secondary | ICD-10-CM

## 2022-01-23 ENCOUNTER — Other Ambulatory Visit: Payer: Self-pay | Admitting: Internal Medicine

## 2022-01-23 DIAGNOSIS — E119 Type 2 diabetes mellitus without complications: Secondary | ICD-10-CM

## 2022-01-27 ENCOUNTER — Telehealth: Payer: Self-pay | Admitting: Pulmonary Disease

## 2022-01-27 NOTE — Telephone Encounter (Signed)
Called and spoke to patient and he is aware to check back with [facility] with either Tan or Caryl Pina regarding CPAP. He will call after he receives his cpap and we will schedule him for 3 months out. Nothing further needed.  ?

## 2022-01-27 NOTE — Telephone Encounter (Signed)
Patient is calling because he has n appt on 01/28/22 but he has not received his CPAP yet. Wants to know if he should keep his appt or reschedule  ? ?Called [place] and spoke to [employer]. She states the person working on patients file to get his cpap ready lef the company in December and they were not aware patient needed to get CPAP still. She states she is giving information to [place] who works on prior [employer] and will have her reach out to patient.  ? ?Patients ov notes from 12/2020 state he needs a 4-6 month follow up  but has not been seen since and does not have CPAP. Dr. Craige Cotta, please advise if you want patient to come in to appt tomorrow or if you want [place] to reschedule?  ? ?Thanks!  ?

## 2022-01-27 NOTE — Telephone Encounter (Signed)
Need to reschedule appointment to 3 months after he gets his CPAP. ?

## 2022-01-28 ENCOUNTER — Ambulatory Visit: Payer: Medicaid Other | Admitting: Pulmonary Disease

## 2022-01-28 ENCOUNTER — Other Ambulatory Visit: Payer: Self-pay | Admitting: Internal Medicine

## 2022-02-18 ENCOUNTER — Encounter: Payer: Self-pay | Admitting: Internal Medicine

## 2022-02-19 ENCOUNTER — Ambulatory Visit: Payer: Medicaid Other | Admitting: Internal Medicine

## 2022-02-22 ENCOUNTER — Ambulatory Visit: Payer: Medicaid Other | Admitting: Internal Medicine

## 2022-03-04 ENCOUNTER — Encounter: Payer: Self-pay | Admitting: Nurse Practitioner

## 2022-03-04 ENCOUNTER — Ambulatory Visit (INDEPENDENT_AMBULATORY_CARE_PROVIDER_SITE_OTHER): Payer: Medicaid Other | Admitting: Nurse Practitioner

## 2022-03-04 VITALS — BP 125/80 | HR 64 | Ht 68.0 in | Wt 266.0 lb

## 2022-03-04 DIAGNOSIS — L739 Follicular disorder, unspecified: Secondary | ICD-10-CM | POA: Diagnosis not present

## 2022-03-04 DIAGNOSIS — L089 Local infection of the skin and subcutaneous tissue, unspecified: Secondary | ICD-10-CM

## 2022-03-04 DIAGNOSIS — T148XXA Other injury of unspecified body region, initial encounter: Secondary | ICD-10-CM | POA: Diagnosis not present

## 2022-03-04 DIAGNOSIS — R519 Headache, unspecified: Secondary | ICD-10-CM | POA: Diagnosis not present

## 2022-03-04 DIAGNOSIS — G8929 Other chronic pain: Secondary | ICD-10-CM

## 2022-03-04 MED ORDER — SUMATRIPTAN SUCCINATE 50 MG PO TABS
50.0000 mg | ORAL_TABLET | ORAL | 0 refills | Status: DC | PRN
Start: 2022-03-04 — End: 2022-04-22

## 2022-03-04 MED ORDER — SULFAMETHOXAZOLE-TRIMETHOPRIM 800-160 MG PO TABS
1.0000 | ORAL_TABLET | Freq: Two times a day (BID) | ORAL | 0 refills | Status: AC
Start: 2022-03-04 — End: 2022-03-11

## 2022-03-04 NOTE — Progress Notes (Signed)
? ?  Isaac Weiss     MRN: [id number]      DOB: Dec 17, 1980 ? ? ?HPI ?[name] with past medical history of COPD, type 2 diabetes, depression and anxiety, tobacco abuse, schizo affective disorder is here for complaints of wound infection pt stated that he dropped a mirror on his leg 4 days ago, went to the ER got it sutured, , he has since noticed that the wound site is red and black has sharp, burning  pain 5/10, site feels tight. Takes ibuprofen and tylenol as needed for his pain . He was given TDAP vaccine. States that he was told that Sutures to be left in place 7-10days.  Patient denies fever, chills, malaise, drainage from the wound ? ? ?Pt c/o of generalized chronic dull  headache that got worse since 6 months ago. pain last for a1-2 days when it happens. Pain is not associated with nausea, vomiting, changes in his vision,. Light, sound makes the pain worse, sleep and darkness makes his pain better. Ibuprofen helps his pain sometimes. Patient denies numbness, tingling seizures. States that he gets adequate rest and sleep. Has HA 4-5 times days a week.   ? ? ?Patient complains of bumps on his head that hurts, states that the bumps comes and goes, he denies itching, drainage, fever, chills ? ? ? ? ?ROS ?Denies recent fever or chills. ?Denies sinus pressure, nasal congestion, ear pain or sore throat. ?Denies chest congestion, productive cough or wheezing. ?Denies chest pains, palpitations and leg swelling ?Denies abdominal pain, nausea, vomiting,diarrhea or constipation.   ?Denies dysuria, frequency, hesitancy or incontinence. ? ? ? ? ?PE ? ?BP 125/80 (BP Location: Right Arm, Patient Position: Sitting, Cuff Size: Large)   Pulse 64   Ht 5\' 8"  (1.727 m)   Wt 266 lb (120.7 kg)   SpO2 97%   BMI 40.45 kg/m?  ? ?Patient alert and oriented and in no cardiopulmonary distress. ? ?HEENT: No facial asymmetry, EOMI,     Neck supple . One erythematous lesion around hair follicle noted on the frontal part of the  head, lesion non draining  ? ?Chest: Clear to auscultation bilaterally. ? ?CVS: S1, S2 no murmurs, no S3.Regular rate. ? ?ABD: Soft non tender.  ? ?Ext: No edema ? ?MS: Adequate ROM spine, shoulders, hips and knees. ? ?Skin: laceration site along the anterior aspect of right lower extremity appears red ,the middle part appears dark in color sutures present, no drainage noted, sensation intact, site feels warm to touch ? ?Psych: Good eye contact, normal affect. Memory intact not anxious or depressed appearing. ? ?CNS: CN 2-12 intact, power,  normal throughout.no focal deficits noted. ? ? ?Assessment & Plan ?Chronic nonintractable headache ?Chronic condition ?Will treat with Imitrex 50 mg as needed ?Continue Tylenol and ibuprofen as needed. ? ? ?Wound infection ?Has history of type 2 diabetes, current smoker ?Rx Bactrim DS 800-160 mg tablets twice daily for 7 days ?Keep site clean and dry ?Continue ibuprofen as needed for pain ?Follow-up in 6 days for suture removal  ? ?Folliculitis ?Chronic condition ?Patient given Bactrim DS today for wound infection. ?This should help with his folliculitis as well. ?Keep hair clean and shampoo regularly  ? ?

## 2022-03-04 NOTE — Assessment & Plan Note (Addendum)
Chronic condition ?Patient given Bactrim DS today for wound infection. ?This should help with his folliculitis as well. ?Keep hair clean and shampoo regularly ?

## 2022-03-04 NOTE — Assessment & Plan Note (Addendum)
Has history of type 2 diabetes, current smoker ?Rx Bactrim DS 800-160 mg tablets twice daily for 7 days ?Keep site clean and dry ?Continue ibuprofen as needed for pain ?Follow-up in 6 days for suture removal  ?

## 2022-03-04 NOTE — Patient Instructions (Addendum)
Please take Bactrim DS take 1 tablet by mouth 2 (two) times daily for 7 days.  Please keep your wound site clean and dry.  ? ?Please take Imitrex 50 mg tablet daily as needed for your headache, continue Tylenol or ibuprofen as needed. Administration early in the course of a migraine attack  ? ? ?It is important that you exercise regularly at least 30 minutes 5 times a week.  ?Think about what you will eat, plan ahead. ?Choose " clean, green, fresh or frozen" over canned, processed or packaged foods which are more sugary, salty and fatty. ?70 to 75% of food eaten should be vegetables and fruit. ?Three meals at set times with snacks allowed between meals, but they must be fruit or vegetables. ?Aim to eat over a 12 hour period , example 7 am to 7 pm, and STOP after  your last meal of the day. ?Drink water,generally about 64 ounces per day, no other drink is as healthy. Fruit juice is best enjoyed in a healthy way, by EATING the fruit. ? ?Thanks for choosing [facility], we consider it a privelige to serve you.  ?

## 2022-03-04 NOTE — Assessment & Plan Note (Signed)
Chronic condition ?Will treat with Imitrex 50 mg as needed ?Continue Tylenol and ibuprofen as needed. ? ?

## 2022-03-18 ENCOUNTER — Ambulatory Visit: Payer: Medicaid Other | Admitting: Internal Medicine

## 2022-03-23 ENCOUNTER — Ambulatory Visit: Payer: Medicaid Other | Admitting: Internal Medicine

## 2022-04-01 ENCOUNTER — Encounter: Payer: Self-pay | Admitting: *Deleted

## 2022-04-01 ENCOUNTER — Ambulatory Visit: Payer: Medicaid Other | Admitting: Internal Medicine

## 2022-04-06 ENCOUNTER — Other Ambulatory Visit: Payer: Self-pay | Admitting: Internal Medicine

## 2022-04-18 ENCOUNTER — Other Ambulatory Visit: Payer: Self-pay | Admitting: Internal Medicine

## 2022-04-18 DIAGNOSIS — E119 Type 2 diabetes mellitus without complications: Secondary | ICD-10-CM

## 2022-04-22 ENCOUNTER — Encounter: Payer: Self-pay | Admitting: Internal Medicine

## 2022-04-22 ENCOUNTER — Ambulatory Visit (INDEPENDENT_AMBULATORY_CARE_PROVIDER_SITE_OTHER): Payer: Medicaid Other | Admitting: Internal Medicine

## 2022-04-22 ENCOUNTER — Ambulatory Visit: Payer: Medicaid Other | Admitting: Internal Medicine

## 2022-04-22 VITALS — BP 118/78 | HR 82 | Resp 18 | Ht 69.0 in | Wt 261.2 lb

## 2022-04-22 DIAGNOSIS — E782 Mixed hyperlipidemia: Secondary | ICD-10-CM | POA: Diagnosis not present

## 2022-04-22 DIAGNOSIS — Z794 Long term (current) use of insulin: Secondary | ICD-10-CM

## 2022-04-22 DIAGNOSIS — Z720 Tobacco use: Secondary | ICD-10-CM

## 2022-04-22 DIAGNOSIS — T8130XA Disruption of wound, unspecified, initial encounter: Secondary | ICD-10-CM

## 2022-04-22 DIAGNOSIS — E1169 Type 2 diabetes mellitus with other specified complication: Secondary | ICD-10-CM | POA: Diagnosis not present

## 2022-04-22 DIAGNOSIS — L0882 Omphalitis not of newborn: Secondary | ICD-10-CM | POA: Insufficient documentation

## 2022-04-22 DIAGNOSIS — F3160 Bipolar disorder, current episode mixed, unspecified: Secondary | ICD-10-CM

## 2022-04-22 MED ORDER — FLUOXETINE HCL 40 MG PO CAPS: 40.0000 mg | ORAL_CAPSULE | Freq: Every day | ORAL | 1 refills | Status: DC

## 2022-04-22 MED ORDER — MUPIROCIN 2 % EX OINT: 1.0000 | TOPICAL_OINTMENT | Freq: Two times a day (BID) | CUTANEOUS | 0 refills | Status: DC

## 2022-04-22 NOTE — Assessment & Plan Note (Signed)
Asked about quitting: confirms that he currently smokes cigarettes Advise to quit smoking: Educated about QUITTING to reduce the risk of cancer, cardio and cerebrovascular disease. Assess willingness: Unwilling to quit or cut down at this time Assist with counseling and pharmacotherapy: Counseled for 5 minutes and literature provided. Arrange for follow up: Follow up and continue to offer help.

## 2022-04-22 NOTE — Assessment & Plan Note (Signed)
Has her intermittent umbilical discharge despite clotrimazole-betamethasone cream treatment Started mupirocin ointment for S. aureus coverage

## 2022-04-22 NOTE — Progress Notes (Signed)
Established Patient Office Visit  Subjective:  Patient ID: Isaac Weiss, male    DOB: Mar 11, 1981  Age: 41 y.o. MRN: [id number]  CC:  Chief Complaint  Patient presents with   Follow-up    Follow up pt belly button no better since last appt also he no longer needs stitches removed as he removed them himself.     HPI Isaac Weiss is a 41 y.o. male with past medical history of diabetes mellitus, COPD, sleep apnea, bipolar disorder, anxiety, PTSD, tobacco abuse and morbid obesity who presents for f/u of his chronic medical conditions.  He had a laceration injury or right lower leg on 04/29, and had stitches placed in ER.  He had wound infection, was treated with Bactrim later.  He has removed stitches on his own. He still has wound scab/dehiscence since then.  He complains of itching around the wound area.  Of note, he has history of type II DM.  Type II DM: : His last HbA1C had improved to 6.8. He has been taking Lantus 30 U qHS and Trulicity. His blood glucose ranges around 100s most of the time, but goes up to 190 at times. He also takes Metformin and Glipizide. He denies any polyuria or polyphagia.  He has lost almost 46 pounds since 02/22.  Bipolar disorder: He used to see [facility] psychiatry for it.  He has run out of lithium and Prozac now.  He was also on Latuda and Lamictal.  He has had episodes of agitation, but denies any SI or HI.  He still complains of umbilical area discharge, yellowish and blood tinged at times.  Denies any local erythema or swelling.  He has completed clotrimazole-betamethasone with no relief.  Past Medical History:  Diagnosis Date   Anxiety    [employer]reesia 07/27/2020   Arthritis    [employer]reesia 07/27/2020   Asthma    COPD (chronic obstructive pulmonary disease) ([place])    Depression    [employer]rees[employer] 07/27/2020   Diabetes mellitus without complication ([place])    Hyperlipidemia    Phreesia 07/27/2020   Neuromuscular disorder ([place])    [employer]reesia 07/27/2020    Sleep apnea    [employer]reesia 07/27/2020    History reviewed. No pertinent surgical history.  History reviewed. No pertinent family history.  Social History   Socioeconomic History   Marital status: Married    Spouse name: Not on file   Number of children: Not on file   Years of education: Not on file   Highest education level: Not on file  Occupational History   Not on file  Tobacco Use   Smoking status: Every Day    Packs/day: 1.00    Types: Cigarettes   Smokeless tobacco: Never  Vaping Use   Vaping Use: Never used  Substance and Sexual Activity   Alcohol use: Not Currently   Drug use: Never   Sexual activity: Not on file  Other Topics Concern   Not on file  Social History Narrative   Not on file   Social Determinants of Health   Financial Resource Strain: Not on file  Food Insecurity: Not on file  Transportation Needs: Not on file  Physical Activity: Not on file  Stress: Not on file  Social Connections: Not on file  Intimate Partner Violence: Not on file    Outpatient Medications Prior to Visit  Medication Sig Dispense Refill   Accu-Chek FastClix Lancets MISC USE TO CHECK BLOOD SUGAR FOUR TIMES DAILY BEFORE MEALS AND AT BEDTIME 102 each  10   ACCU-CHEK GUIDE test strip USE TO TEST BLOOD SUGAR THREE TIMES DAILY 100 strip 10   albuterol (VENTOLIN HFA) 108 (90 Base) MCG/ACT inhaler INHALE TWO PUFFS BY MOUTH EVERY 6 HOURS AS NEEDED FOR wheezing, SHORTNESS OF BREATH 18 g 6   B-D INS SYR MICROFINE 1CC/28G 28G X 1/2" 1 ML MISC USE WITH INSULIN THREE TIMES DAILY BEFORE MEALS 100 each 5   FLOVENT HFA 110 MCG/ACT inhaler INHALE TWO PUFFS BY MOUTH TWICE DAILY 12 g 2   glipiZIDE (GLUCOTROL) 10 MG tablet TAKE 1 TABLET BY MOUTH TWICE DAILY BEFORE a meal 60 tablet 3   Insulin Syringe 27G X 1/2" 1 ML MISC Inject 10 Units into the skin 3 (three) times daily before meals. 200 each 3   LANTUS 100 UNIT/ML injection INJECT 30 UNITS into THE SKIN DAILY 10 mL 2   metFORMIN (GLUCOPHAGE-XR)  500 MG 24 hr tablet TAKE TWO TABLETS BY MOUTH TWICE DAILY 259 tablet 2   TRULICITY 1.5 DG/3.8VF SOPN INJECT 1.5MG INTO THE SKIN ONCE A WEEK 6 mL 2   HUMULIN 70/30 (70-30) 100 UNIT/ML injection INJECT 30 UNITS INTO THE SKIN THREE TIMES DAILY BEFORE MEALS 20 mL 7   hydrOXYzine (ATARAX) 25 MG tablet Take 25 mg by mouth 3 (three) times daily as needed. (Patient not taking: Reported on 04/22/2022)     lithium carbonate (ESKALITH) 450 MG CR tablet Take 900 mg by mouth daily. (Patient not taking: Reported on 04/22/2022)     lurasidone (LATUDA) 20 MG TABS tablet Take 20 mg by mouth daily. (Patient not taking: Reported on 04/22/2022)     neomycin-polymyxin-hydrocortisone (CORTISPORIN) OTIC solution Place 3 drops into the left ear 4 (four) times daily. X 5-7 days (Patient not taking: Reported on 04/22/2022) 10 mL 0   atomoxetine (STRATTERA) 40 MG capsule Take 40 mg by mouth every morning. (Patient not taking: Reported on 04/22/2022)     Blood Glucose Monitoring Suppl (ACCU-CHEK COMPACT CARE KIT) KIT 1 kit by Does not apply route 4 (four) times daily -  before meals and at bedtime. (Patient not taking: Reported on 04/22/2022) 1 kit 0   clotrimazole-betamethasone (LOTRISONE) cream Apply 1 application topically daily. (Patient not taking: Reported on 04/22/2022) 30 g 0   FLUoxetine (PROZAC) 40 MG capsule Take 40 mg by mouth every morning. (Patient not taking: Reported on 04/22/2022)     SUMAtriptan (IMITREX) 50 MG tablet Take 1 tablet (50 mg total) by mouth every 2 (two) hours as needed for migraine. May repeat in 2 hours if headache persists or recurs. (Patient not taking: Reported on 04/22/2022) 10 tablet 0   No facility-administered medications prior to visit.    No Known Allergies  ROS Review of Systems  Constitutional:  Negative for chills and fever.  HENT:  Negative for congestion and sore throat.   Eyes:  Negative for pain and discharge.  Respiratory:  Negative for cough and shortness of breath.    Cardiovascular:  Negative for chest pain and palpitations.  Gastrointestinal:  Negative for diarrhea, nausea and vomiting.  Endocrine: Negative for polydipsia and polyuria.  Genitourinary:  Negative for dysuria and hematuria.  Musculoskeletal:  Positive for arthralgias and back pain. Negative for neck pain and neck stiffness.       Right shoulder pain - better now  Skin:  Negative for wound.  Neurological:  Negative for dizziness, weakness, numbness and headaches.  Psychiatric/Behavioral:  Negative for agitation and behavioral problems.       Objective:  Physical Exam Vitals reviewed.  Constitutional:      General: He is not in acute distress.    Appearance: He is obese. He is not diaphoretic.  HENT:     Head: Normocephalic and atraumatic.     Nose: Nose normal.     Mouth/Throat:     Mouth: Mucous membranes are moist.  Eyes:     General: No scleral icterus.    Extraocular Movements: Extraocular movements intact.  Cardiovascular:     Rate and Rhythm: Normal rate and regular rhythm.     Pulses: Normal pulses.     Heart sounds: Normal heart sounds. No murmur heard. Pulmonary:     Breath sounds: Normal breath sounds. No wheezing or rales.  Abdominal:     Palpations: Abdomen is soft.     Comments: Umbilical area and dry, no erythema, no visible discharge noted  Musculoskeletal:     Right shoulder: No swelling, effusion or bony tenderness. Normal range of motion.     Left shoulder: No swelling, effusion or bony tenderness. Normal range of motion.     Cervical back: Neck supple. No tenderness.     Right lower leg: No edema.     Left lower leg: No edema.  Skin:    General: Skin is warm.     Comments: Right leg wound, scabbing about 2 cm in diameter with surrounding erythema  Neurological:     General: No focal deficit present.     Mental Status: He is alert and oriented to person, place, and time.     Sensory: No sensory deficit.     Motor: No weakness.  Psychiatric:         Mood and Affect: Mood normal.        Behavior: Behavior normal.     BP 118/78 (BP Location: Left Arm, Patient Position: Sitting, Cuff Size: Normal)   Pulse 82   Resp 18   Ht 5' 9"  (1.753 m)   Wt 261 lb 3.2 oz (118.5 kg)   SpO2 96%   BMI 38.57 kg/m  Wt Readings from Last 3 Encounters:  04/22/22 261 lb 3.2 oz (118.5 kg)  03/04/22 266 lb (120.7 kg)  10/21/21 267 lb 1.3 oz (121.1 kg)    Lab Results  Component Value Date   TSH 3.010 10/21/2021   Lab Results  Component Value Date   WBC 8.8 10/21/2021   HGB 16.5 10/21/2021   HCT 48.4 10/21/2021   MCV 97 10/21/2021   PLT 292 10/21/2021   Lab Results  Component Value Date   NA 139 10/21/2021   K 4.6 10/21/2021   CO2 24 10/21/2021   GLUCOSE 164 (H) 10/21/2021   BUN 9 10/21/2021   CREATININE 0.92 10/21/2021   BILITOT 0.3 10/21/2021   ALKPHOS 88 10/21/2021   AST 20 10/21/2021   ALT 24 10/21/2021   PROT 6.5 10/21/2021   ALBUMIN 4.4 10/21/2021   CALCIUM 9.0 10/21/2021   ANIONGAP 12 09/07/2019   EGFR 108 10/21/2021   Lab Results  Component Value Date   CHOL 136 06/17/2021   Lab Results  Component Value Date   HDL 30 (L) 06/17/2021   Lab Results  Component Value Date   LDLCALC 80 06/17/2021   Lab Results  Component Value Date   TRIG 146 06/17/2021   Lab Results  Component Value Date   CHOLHDL 4.5 06/17/2021   Lab Results  Component Value Date   HGBA1C 6.7 (H) 10/21/2021      Assessment &  Plan:   Problem List Items Addressed This Visit       Endocrine   Type 2 diabetes mellitus with other specified complication ([place]) - Primary    Lab Results  Component Value Date   HGBA1C 6.7 (H) 10/21/2021   needs updated blood tests Home blood glucose readings better now with   On Metformin 500 mg BID and Glipizide 10 mg BID  Continue Lantus 30 U qHS Blood glucose around 100s at home most of the time, according to the patient Continue Trulicity to 1.5 mg every week, will adjust according to  HbA1C Advised to comply with the treatment Advised to follow diabetic diet F/u CMP and lipid panel      Relevant Orders   Microalbumin / creatinine urine ratio   CMP14+EGFR   Hemoglobin A1c     Other   Bipolar disorder ([place])    Followed up with [facility] psychiatry, now does not have any Psychiatry provider, referred to compression care in [place] Refilled Prozac for now On lithium, Lamictal and Vistaril Was on Strattera for ADD      Relevant Medications   FLUoxetine (PROZAC) 40 MG capsule   Other Relevant Orders   Ambulatory referral to Psychiatry   Tobacco abuse    Asked about quitting: confirms that he currently smokes cigarettes Advise to quit smoking: Educated about QUITTING to reduce the risk of cancer, cardio and cerebrovascular disease. Assess willingness: Unwilling to quit or cut down at this time Assist with counseling and pharmacotherapy: Counseled for 5 minutes and literature provided. Arrange for follow up: Follow up and continue to offer help.      Wound dehiscence    Still has wound dehiscence over right leg, about 2 cm in diameter Referred to wound clinic Has history of type II DM, at high risk for nonhealing wound and complications      Relevant Orders   Ambulatory referral to [place] in adult    Has her intermittent umbilical discharge despite clotrimazole-betamethasone cream treatment Started mupirocin ointment for S. aureus coverage      Relevant Medications   mupirocin ointment (BACTROBAN) 2 %   Other Visit Diagnoses     Mixed hyperlipidemia       Relevant Orders   Lipid Profile       Meds ordered this encounter  Medications   mupirocin ointment (BACTROBAN) 2 %    Sig: Apply 1 Application topically 2 (two) times daily.    Dispense:  22 g    Refill:  0   FLUoxetine (PROZAC) 40 MG capsule    Sig: Take 1 capsule (40 mg total) by mouth daily.    Dispense:  90 capsule    Refill:  1    Follow-up: Return in about 6 months  (around 10/22/2022) for Annual physical.    Lindell Spar, MD

## 2022-04-22 NOTE — Assessment & Plan Note (Addendum)
Still has wound dehiscence over right leg, about 2 cm in diameter Referred to wound clinic Has history of type II DM, at high risk for nonhealing wound and complications

## 2022-04-22 NOTE — Assessment & Plan Note (Signed)
Followed up with [facility] psychiatry, now does not have any Psychiatry provider, referred to compression care in [facility] Refilled Prozac for now On lithium, Lamictal and Vistaril Was on Strattera for ADD

## 2022-04-22 NOTE — Patient Instructions (Signed)
Please use Mupirocin ointment over umbilical area. Keep area clean and dry.  Please continue taking medications as prescribed.  Please continue to follow low carb diet and perform moderate exercise/walking at least 150 mins/week.

## 2022-04-22 NOTE — Assessment & Plan Note (Addendum)
Lab Results  Component Value Date   HGBA1C 6.7 (H) 10/21/2021    needs updated blood tests Home blood glucose readings better now with   On Metformin 500 mg BID and Glipizide 10 mg BID  Continue Lantus 30 U qHS Blood glucose around 100s at home most of the time, according to the patient Continue Trulicity to 1.5 mg every week, will adjust according to HbA1C Advised to comply with the treatment Advised to follow diabetic diet F/u CMP and lipid panel

## 2022-04-24 ENCOUNTER — Other Ambulatory Visit: Payer: Self-pay | Admitting: Internal Medicine

## 2022-04-24 DIAGNOSIS — E119 Type 2 diabetes mellitus without complications: Secondary | ICD-10-CM

## 2022-04-24 LAB — LIPID PANEL
Chol/HDL Ratio: 4.8 ratio (ref 0.0–5.0)
Cholesterol, Total: 149 mg/dL (ref 100–199)
HDL: 31 mg/dL — ABNORMAL LOW (ref >39)
LDL Chol Calc (NIH): 83 mg/dL (ref 0–99)
Triglycerides: 209 mg/dL — ABNORMAL HIGH (ref 0–149)
VLDL Cholesterol Cal: 35 mg/dL (ref 5–40)

## 2022-04-24 LAB — CMP14+EGFR
ALT: 22 IU/L (ref 0–44)
AST: 16 IU/L (ref 0–40)
Albumin/Globulin Ratio: 1.8 (ref 1.2–2.2)
Albumin: 4.3 g/dL (ref 4.0–5.0)
Alkaline Phosphatase: 92 IU/L (ref 44–121)
BUN/Creatinine Ratio: 7 — ABNORMAL LOW (ref 9–20)
BUN: 6 mg/dL (ref 6–24)
Bilirubin Total: 0.5 mg/dL (ref 0.0–1.2)
CO2: 26 mmol/L (ref 20–29)
Calcium: 9 mg/dL (ref 8.7–10.2)
Chloride: 102 mmol/L (ref 96–106)
Creatinine, Ser: 0.84 mg/dL (ref 0.76–1.27)
Globulin, Total: 2.4 g/dL (ref 1.5–4.5)
Glucose: 213 mg/dL — ABNORMAL HIGH (ref 70–99)
Potassium: 4.4 mmol/L (ref 3.5–5.2)
Sodium: 142 mmol/L (ref 134–144)
Total Protein: 6.7 g/dL (ref 6.0–8.5)
eGFR: 112 mL/min/{1.73_m2} (ref >59)

## 2022-04-24 LAB — HEMOGLOBIN A1C
Est. average glucose Bld gHb Est-mCnc: 177 mg/dL
Hgb A1c MFr Bld: 7.8 % — ABNORMAL HIGH (ref 4.8–5.6)

## 2022-04-24 LAB — MICROALBUMIN / CREATININE URINE RATIO
Creatinine, Urine: 360 mg/dL
Microalb/Creat Ratio: 12 mg/g creat (ref 0–29)
Microalbumin, Urine: 41.5 ug/mL

## 2022-04-26 ENCOUNTER — Ambulatory Visit: Payer: Medicaid Other | Admitting: Orthopedic Surgery

## 2022-04-26 ENCOUNTER — Other Ambulatory Visit: Payer: Self-pay | Admitting: Internal Medicine

## 2022-04-26 DIAGNOSIS — E119 Type 2 diabetes mellitus without complications: Secondary | ICD-10-CM

## 2022-04-30 IMAGING — DX DG CHEST 2V
2 series · 2 of 2 positions shown · non-contrast
Comparison: No prior.

CLINICAL DATA: Chronic cough.

EXAM:
CHEST - 2 VIEW

[chest pa]
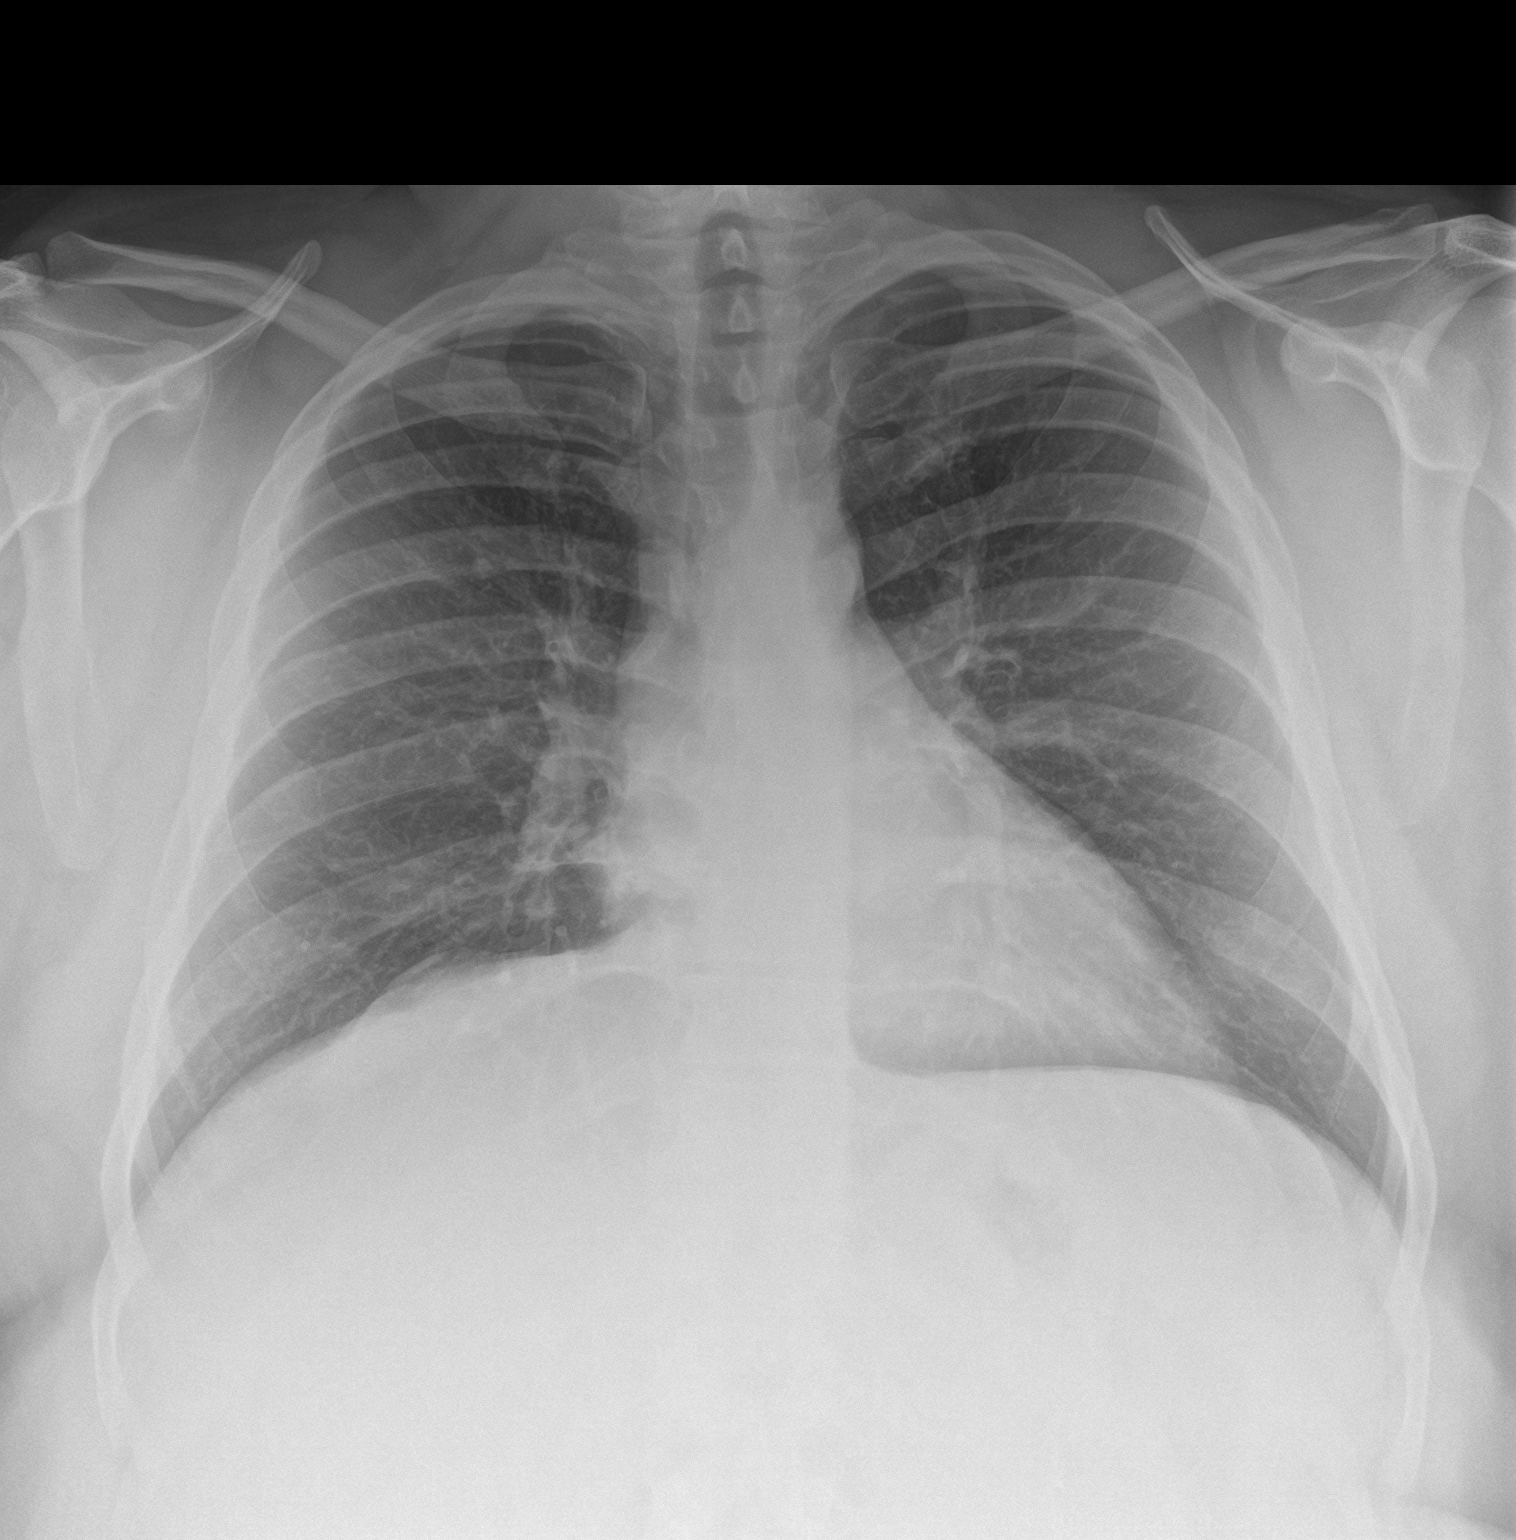

[chest lat]
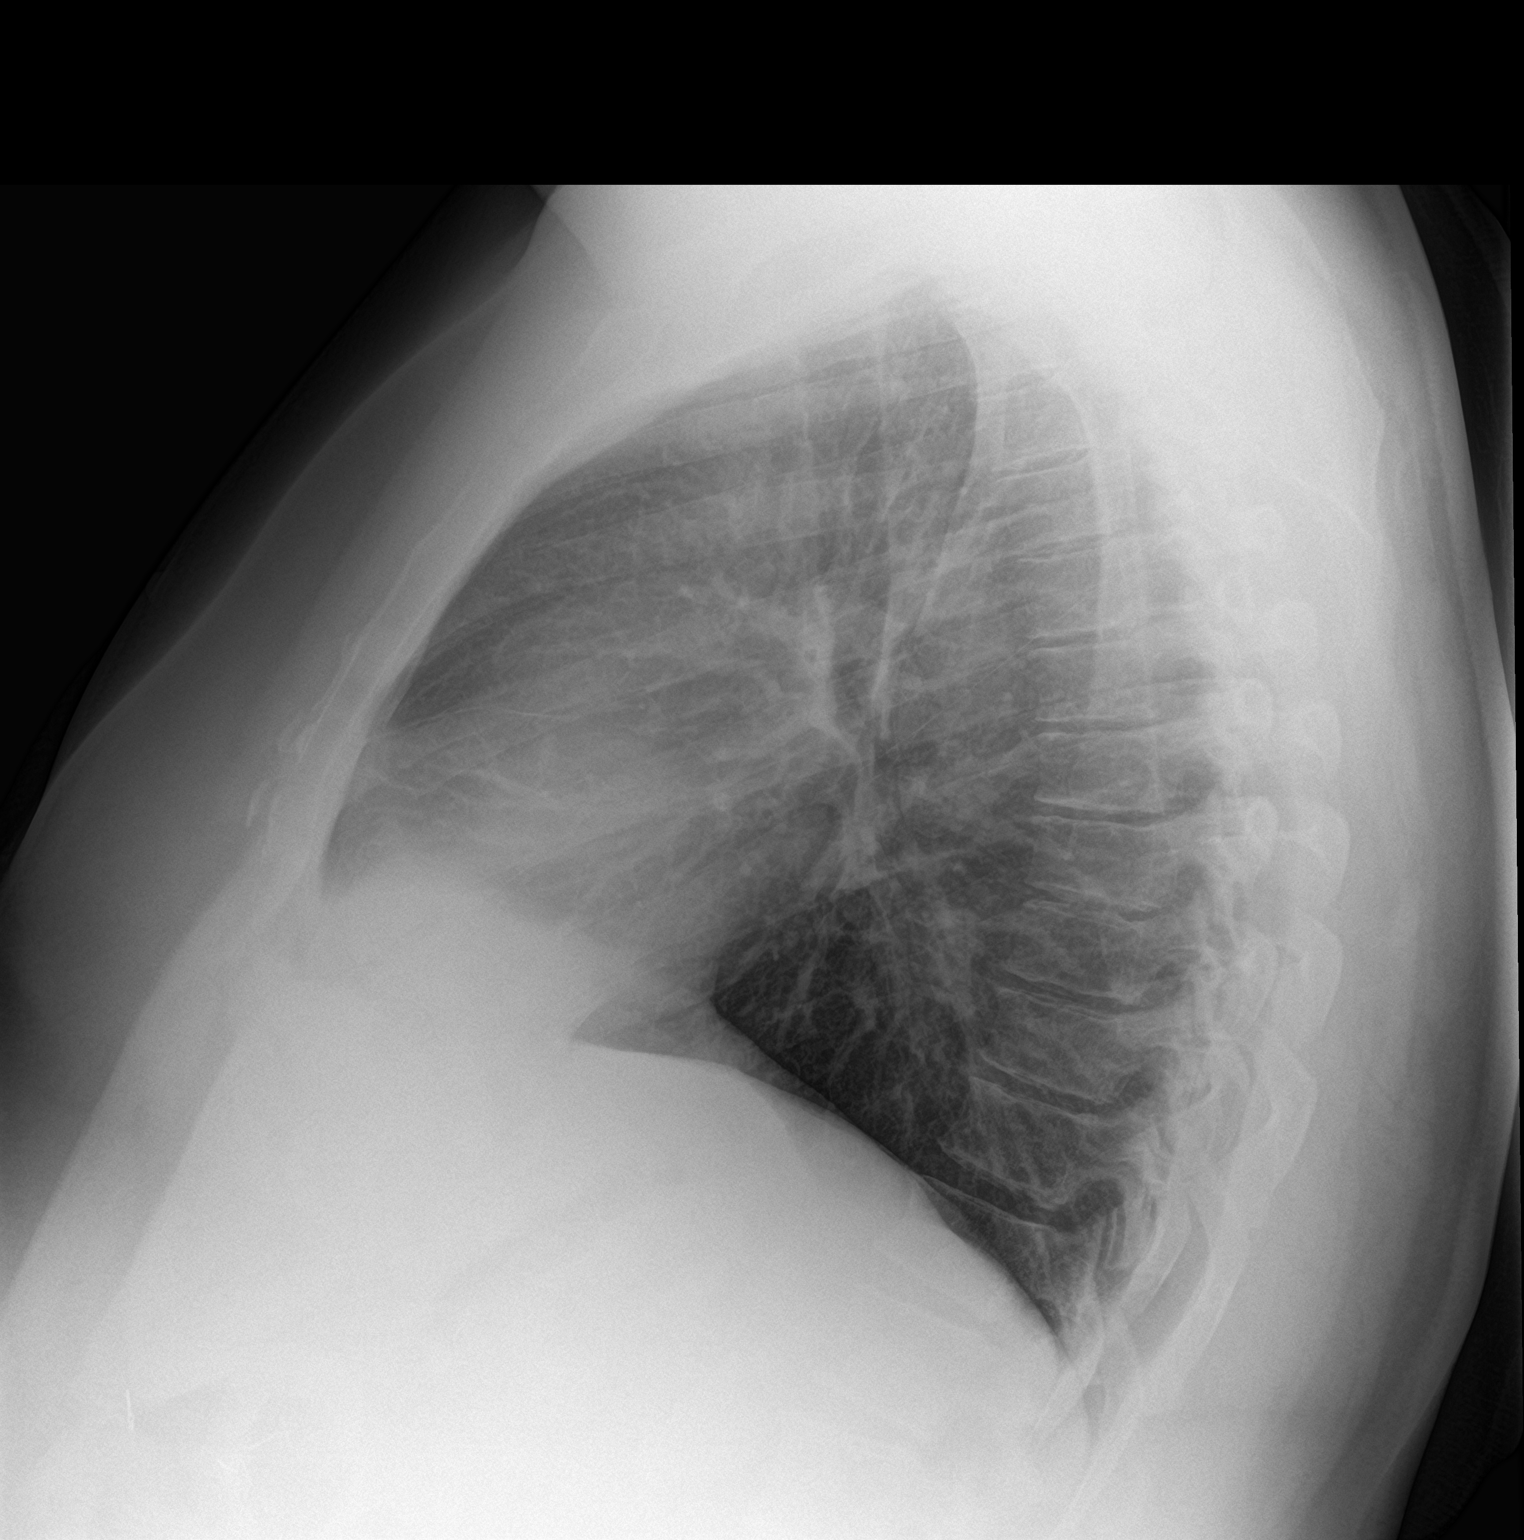

[2 of 2 positions shown; findings below may reference images not displayed]

FINDINGS: Mediastinum hilar structures normal. Heart size normal. Mild
peribronchial cuffing. Mild bronchitis cannot be excluded. Low lung
volumes. No focal infiltrate. No pleural effusion or pneumothorax.
No acute bony abnormality.
IMPRESSION: Mild peribronchial cuffing. Mild bronchitis cannot be excluded.

## 2022-09-09 ENCOUNTER — Other Ambulatory Visit: Payer: Self-pay | Admitting: Internal Medicine

## 2022-09-09 DIAGNOSIS — E119 Type 2 diabetes mellitus without complications: Secondary | ICD-10-CM

## 2022-10-06 ENCOUNTER — Other Ambulatory Visit: Payer: Self-pay | Admitting: Internal Medicine

## 2022-10-06 DIAGNOSIS — E119 Type 2 diabetes mellitus without complications: Secondary | ICD-10-CM

## 2022-10-28 ENCOUNTER — Encounter: Payer: Medicaid Other | Admitting: Internal Medicine

## 2022-10-28 ENCOUNTER — Encounter: Payer: Self-pay | Admitting: Internal Medicine

## 2022-10-31 ENCOUNTER — Other Ambulatory Visit: Payer: Self-pay | Admitting: Internal Medicine

## 2022-10-31 DIAGNOSIS — E119 Type 2 diabetes mellitus without complications: Secondary | ICD-10-CM

## 2022-11-07 ENCOUNTER — Other Ambulatory Visit: Payer: Self-pay | Admitting: Family Medicine

## 2022-12-03 ENCOUNTER — Other Ambulatory Visit: Payer: Self-pay | Admitting: Internal Medicine

## 2022-12-03 DIAGNOSIS — E119 Type 2 diabetes mellitus without complications: Secondary | ICD-10-CM

## 2022-12-05 ENCOUNTER — Other Ambulatory Visit: Payer: Self-pay | Admitting: Internal Medicine

## 2022-12-05 DIAGNOSIS — F3160 Bipolar disorder, current episode mixed, unspecified: Secondary | ICD-10-CM

## 2022-12-28 LAB — TSH: TSH: 1.39 (ref 0.41–5.90)

## 2022-12-28 LAB — LIPID PANEL
LDL Cholesterol: 62
Triglycerides: 142 (ref 40–160)

## 2022-12-28 LAB — HEMOGLOBIN A1C: Hemoglobin A1C: 10.2

## 2022-12-28 LAB — BASIC METABOLIC PANEL
BUN: 8 (ref 4–21)
Creatinine: 0.8 (ref 0.6–1.3)
Glucose: 303

## 2022-12-28 LAB — COMPREHENSIVE METABOLIC PANEL: eGFR: 114

## 2022-12-30 ENCOUNTER — Encounter: Payer: Self-pay | Admitting: Radiology

## 2023-01-06 NOTE — Patient Instructions (Signed)

## 2023-01-07 ENCOUNTER — Encounter: Payer: Medicaid Other | Admitting: Nurse Practitioner

## 2023-01-07 DIAGNOSIS — E1165 Type 2 diabetes mellitus with hyperglycemia: Secondary | ICD-10-CM

## 2023-01-10 NOTE — Progress Notes (Signed)
Erroneous encounter

## 2023-01-17 ENCOUNTER — Encounter: Payer: Self-pay | Admitting: Nurse Practitioner

## 2023-01-18 ENCOUNTER — Ambulatory Visit (INDEPENDENT_AMBULATORY_CARE_PROVIDER_SITE_OTHER): Payer: Medicaid Other | Admitting: Nurse Practitioner

## 2023-01-18 ENCOUNTER — Encounter: Payer: Self-pay | Admitting: Nurse Practitioner

## 2023-01-18 VITALS — BP 108/70 | HR 71 | Ht 69.0 in | Wt 241.8 lb

## 2023-01-18 DIAGNOSIS — E11319 Type 2 diabetes mellitus with unspecified diabetic retinopathy without macular edema: Secondary | ICD-10-CM | POA: Diagnosis not present

## 2023-01-18 DIAGNOSIS — E119 Type 2 diabetes mellitus without complications: Secondary | ICD-10-CM

## 2023-01-18 DIAGNOSIS — Z794 Long term (current) use of insulin: Secondary | ICD-10-CM | POA: Diagnosis not present

## 2023-01-18 DIAGNOSIS — E1165 Type 2 diabetes mellitus with hyperglycemia: Secondary | ICD-10-CM

## 2023-01-18 MED ORDER — TRULICITY 3 MG/0.5ML ~~LOC~~ SOAJ: 3.0000 mg | SUBCUTANEOUS | 3 refills | Status: DC

## 2023-01-18 MED ORDER — METFORMIN HCL ER 500 MG PO TB24: 1000.0000 mg | ORAL_TABLET | Freq: Two times a day (BID) | ORAL | 3 refills | Status: DC

## 2023-01-18 MED ORDER — INSULIN GLARGINE 100 UNIT/ML ~~LOC~~ SOLN: 30.0000 [IU] | Freq: Every day | SUBCUTANEOUS | 3 refills | Status: DC

## 2023-01-18 MED ORDER — DEXCOM G7 SENSOR MISC: 1.0000 | 3 refills | Status: DC

## 2023-01-18 NOTE — Progress Notes (Signed)
Endocrinology Consult Note       01/18/2023, 4:49 PM   Subjective:    Patient ID: Isaac Weiss, male    DOB: 05/16/1981.  Isaac Weiss is being seen in consultation for management of currently uncontrolled symptomatic diabetes requested by  Leonie Douglas, MD.   Past Medical History:  Diagnosis Date   Anxiety    Phreesia 07/27/2020   Arthritis    Phreesia 07/27/2020   Asthma    COPD (chronic obstructive pulmonary disease) ([place])    Depression    Phreesia 07/27/2020   Diabetes mellitus without complication ([place])    Hyperlipidemia    Phreesia 07/27/2020   Neuromuscular disorder ([place])    Phreesia 07/27/2020   Sleep apnea    Phreesia 07/27/2020    History reviewed. No pertinent surgical history.  Social History   Socioeconomic History   Marital status: Married    Spouse name: Not on file   Number of children: Not on file   Years of education: Not on file   Highest education level: Not on file  Occupational History   Not on file  Tobacco Use   Smoking status: Every Day    Packs/day: 1    Types: Cigarettes   Smokeless tobacco: Never  Vaping Use   Vaping Use: Never used  Substance and Sexual Activity   Alcohol use: Not Currently   Drug use: Never   Sexual activity: Not on file  Other Topics Concern   Not on file  Social History Narrative   Not on file   Social Determinants of Health   Financial Resource Strain: Not on file  Food Insecurity: Not on file  Transportation Needs: Not on file  Physical Activity: Not on file  Stress: Not on file  Social Connections: Not on file    History reviewed. No pertinent family history.  Outpatient Encounter Medications as of 01/18/2023  Medication Sig   Accu-Chek FastClix Lancets MISC USE TO CHECK BLOOD SUGAR FOUR TIMES DAILY BEFORE MEALS AND AT BEDTIME   ACCU-CHEK GUIDE test strip USE TO TEST BLOOD SUGAR THREE TIMES DAILY   albuterol  (VENTOLIN HFA) 108 (90 Base) MCG/ACT inhaler INHALE TWO PUFFS BY MOUTH EVERY 6 HOURS AS NEEDED FOR wheezing, SHORTNESS OF BREATH   Continuous Blood Gluc Sensor (DEXCOM G7 SENSOR) MISC Inject 1 Application into the skin as directed. Change sensor every 10 days as directed.   Dulaglutide (TRULICITY) 3 0000000 SOPN Inject 3 mg as directed once a week.   FLOVENT HFA 110 MCG/ACT inhaler INHALE TWO PUFFS BY MOUTH TWICE DAILY   FLUoxetine (PROZAC) 40 MG capsule TAKE ONE CAPSULE BY MOUTH EVERY DAY   Insulin Syringe 27G X 1/2" 1 ML MISC Inject 10 Units into the skin 3 (three) times daily before meals.   mupirocin ointment (BACTROBAN) 2 % Apply 1 Application topically 2 (two) times daily.   SURE COMFORT INS SYR 1CC/28G 28G X 1/2" 1 ML MISC USE WITH INSULIN THREE TIMES DAILY BEFORE MEALS   [DISCONTINUED] glipiZIDE (GLUCOTROL) 10 MG tablet TAKE 1 TABLET BY MOUTH TWICE DAILY BEFORE a meal   [DISCONTINUED] insulin glargine (LANTUS) 100 UNIT/ML injection INJECT 30 UNITS into THE SKIN DAILY   [  DISCONTINUED] metFORMIN (GLUCOPHAGE-XR) 500 MG 24 hr tablet TAKE 2 TABLETS BY MOUTH TWICE DAILY   [DISCONTINUED] TRULICITY 1.5 0000000 SOPN INJECT 1.5MG  INTO THE SKIN ONCE A WEEK   hydrOXYzine (ATARAX) 25 MG tablet Take 25 mg by mouth 3 (three) times daily as needed. (Patient not taking: Reported on 04/22/2022)   insulin glargine (LANTUS) 100 UNIT/ML injection Inject 0.3 mLs (30 Units total) into the skin at bedtime.   lithium carbonate (ESKALITH) 450 MG CR tablet Take 900 mg by mouth daily. (Patient not taking: Reported on 04/22/2022)   lurasidone (LATUDA) 20 MG TABS tablet Take 20 mg by mouth daily. (Patient not taking: Reported on 04/22/2022)   metFORMIN (GLUCOPHAGE-XR) 500 MG 24 hr tablet Take 2 tablets (1,000 mg total) by mouth 2 (two) times daily with a meal.   neomycin-polymyxin-hydrocortisone (CORTISPORIN) OTIC solution Place 3 drops into the left ear 4 (four) times daily. X 5-7 days (Patient not taking: Reported on  04/22/2022)   No facility-administered encounter medications on file as of 01/18/2023.    ALLERGIES: No Known Allergies  VACCINATION STATUS: Immunization History  Administered Date(s) Administered   Influenza,inj,Quad PF,6+ Mos 09/12/2019, 07/30/2020, 10/21/2021, 08/10/2022   PFIZER(Purple Top)SARS-COV-2 Vaccination 01/31/2020, 02/25/2020, 11/25/2020   Tdap 02/27/2022    Diabetes He presents for his initial diabetic visit. He has type 2 diabetes mellitus. Onset time: Diagnosed at approx age of 42. His disease course has been fluctuating. Hypoglycemia symptoms include hunger, nervousness/anxiousness, sweats and tremors. Associated symptoms include blurred vision, fatigue, polydipsia, polyuria and weight loss. There are no hypoglycemic complications. Symptoms are stable. Diabetic complications include retinopathy. Risk factors for coronary artery disease include diabetes mellitus, family history, obesity, male sex, tobacco exposure and stress. Current diabetic treatment includes insulin injections and oral agent (monotherapy) (and Trulicity). He is compliant with treatment most of the time. His weight is decreasing steadily. He is following a generally unhealthy diet. When asked about meal planning, he reported none. He has not had a previous visit with a dietitian. He participates in exercise intermittently. (He presents today for his consultation, accompanied by his wife, with no meter or logs to review.  His most recent A1c on 12/28/22 was 10.2%.  He monitors glucose once every other day or so, mostly when symptomatic.  He drinks coffee with cream and sugar, soda, and water.  He typically only eats 1 meal per day with snacks at night.  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  He is due for annual eye exam, has never been seen by podiatry in the past.) An ACE inhibitor/angiotensin II receptor blocker is not being taken. He does not see a podiatrist.Eye exam is not current.     Review of  systems  Constitutional: + steadily decreasing body weight, current Body mass index is 35.71 kg/m., + fatigue, no subjective hyperthermia, no subjective hypothermia Eyes: + blurry vision (+ retinopathy), no xerophthalmia ENT: no sore throat, no nodules palpated in throat, no dysphagia/odynophagia, no hoarseness Cardiovascular: no chest pain, no shortness of breath, no palpitations, no leg swelling Respiratory: no cough, no shortness of breath Gastrointestinal: no nausea/vomiting/diarrhea Musculoskeletal: no muscle/joint aches Skin: no rashes, no hyperemia Neurological: no tremors, no numbness, no tingling, no dizziness Psychiatric: no depression, no anxiety  Objective:     BP 108/70 (BP Location: Right Arm, Patient Position: Sitting, Cuff Size: Large) Comment: retake  Pulse 71   Ht 5\' 9"  (1.753 m)   Wt 241 lb 12.8 oz (109.7 kg)   BMI 35.71 kg/m  Wt Readings from Last 3 Encounters:  01/18/23 241 lb 12.8 oz (109.7 kg)  04/22/22 261 lb 3.2 oz (118.5 kg)  03/04/22 266 lb (120.7 kg)     BP Readings from Last 3 Encounters:  01/18/23 108/70  04/22/22 118/78  03/04/22 125/80     Physical Exam- Limited  Constitutional:  Body mass index is 35.71 kg/m. , not in acute distress, normal state of mind Eyes:  EOMI, no exophthalmos Neck: Supple Cardiovascular: RRR, no murmurs, rubs, or gallops, no edema Respiratory: Adequate breathing efforts, no crackles, rales, rhonchi, or wheezing Musculoskeletal: no gross deformities, strength intact in all four extremities, no gross restriction of joint movements Skin:  no rashes, no hyperemia Neurological: no tremor with outstretched hands    CMP ( most recent) CMP     Component Value Date/Time   NA 142 04/22/2022 1625   K 4.4 04/22/2022 1625   CL 102 04/22/2022 1625   CO2 26 04/22/2022 1625   GLUCOSE 213 (H) 04/22/2022 1625   GLUCOSE 514 (HH) 09/07/2019 2035   BUN 8 12/28/2022 0000   CREATININE 0.8 12/28/2022 0000   CREATININE 0.84  04/22/2022 1625   CALCIUM 9.0 04/22/2022 1625   PROT 6.7 04/22/2022 1625   ALBUMIN 4.3 04/22/2022 1625   AST 16 04/22/2022 1625   ALT 22 04/22/2022 1625   ALKPHOS 92 04/22/2022 1625   BILITOT 0.5 04/22/2022 1625   GFRNONAA 103 09/17/2020 1449   GFRAA 119 09/17/2020 1449     Diabetic Labs (most recent): Lab Results  Component Value Date   HGBA1C 10.2 12/28/2022   HGBA1C 7.8 (H) 04/22/2022   HGBA1C 6.7 (H) 10/21/2021     Lipid Panel ( most recent) Lipid Panel     Component Value Date/Time   CHOL 149 04/22/2022 1625   TRIG 142 12/28/2022 0000   HDL 31 (L) 04/22/2022 1625   CHOLHDL 4.8 04/22/2022 1625   LDLCALC 62 12/28/2022 0000   LDLCALC 83 04/22/2022 1625   LABVLDL 35 04/22/2022 1625      Lab Results  Component Value Date   TSH 1.39 12/28/2022   TSH 3.010 10/21/2021   TSH 4.580 (H) 06/17/2021   TSH 6.680 (H) 09/17/2020   FREET4 1.22 10/21/2021   FREET4 1.10 09/17/2020           Assessment & Plan:   1) Type 2 diabetes mellitus with hyperglycemia, with long-term current use of insulin ([place])  He presents today for his consultation, accompanied by his wife, with no meter or logs to review.  His most recent A1c on 12/28/22 was 10.2%.  He monitors glucose once every other day or so, mostly when symptomatic.  He drinks coffee with cream and sugar, soda, and water.  He typically only eats 1 meal per day with snacks at night.  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  He is due for annual eye exam, has never been seen by podiatry in the past.  - Isaac Weiss has currently uncontrolled symptomatic type 2 DM since 42 years of age, with most recent A1c of 10.2 %.   -Recent labs reviewed.  - I had a long discussion with him about the progressive nature of diabetes and the pathology behind its complications. -his diabetes is complicated by retinopathy and he remains at a high risk for more acute and chronic complications which include CAD, CVA, CKD, retinopathy, and  neuropathy. These are all discussed in detail with him.  The following Lifestyle Medicine recommendations according to American  College of Lifestyle Medicine [facility]) were discussed and offered to patient and he agrees to start the journey:  A. Whole Foods, Plant-based plate comprising of fruits and vegetables, plant-based proteins, whole-grain carbohydrates was discussed in detail with the patient.   A list for source of those nutrients were also provided to the patient.  Patient will use only water or unsweetened tea for hydration. B.  The need to stay away from risky substances including alcohol, smoking; obtaining 7 to 9 hours of restorative sleep, at least 150 minutes of moderate intensity exercise weekly, the importance of healthy social connections,  and stress reduction techniques were discussed. C.  A full color page of  Calorie density of various food groups per pound showing examples of each food groups was provided to the patient.  - I have counseled him on diet and weight management by adopting a carbohydrate restricted/protein rich diet. Patient is encouraged to switch to unprocessed or minimally processed complex starch and increased protein intake (animal or plant source), fruits, and vegetables. -  he is advised to stick to a routine mealtimes to eat 3 meals a day and avoid unnecessary snacks (to snack only to correct hypoglycemia).   - he acknowledges that there is a room for improvement in his food and drink choices. - Suggestion is made for him to avoid simple carbohydrates from his diet including Cakes, Sweet Desserts, Ice Cream, Soda (diet and regular), Sweet Tea, Candies, Chips, Cookies, Store Bought Juices, Alcohol in Excess of 1-2 drinks a day, Artificial Sweeteners, Coffee Creamer, and "Sugar-free" Products. This will help patient to have more stable blood glucose profile and potentially avoid unintended weight gain.  - I have approached him with the following individualized plan  to manage his diabetes and patient agrees:   -He is advised to continue Lantus 30 units SQ nightly.  -he is encouraged to start monitoring glucose at least twice daily, before breakfast and before bed, to log their readings on the clinic sheets provided, and bring them to review at follow up appointment in 2 months.  I did give sample Dexcom G7 and sent in script to pharmacy as he would certainly benefit given insulin treatment.  - he is warned not to take insulin without proper monitoring per orders. - Adjustment parameters are given to him for hypo and hyperglycemia in writing. - he is encouraged to call clinic for blood glucose levels less than 70 or above 300 mg /dl. - he is advised to continue Metformin 1000 mg po twice daily with meals, therapeutically suitable for patient . - his Glipizide will be discontinued, risk outweighs benefit for this patient- says glucose was fluctuating more dramatically when taking this.  - he is high risk for pancreatitis on incretin therapy due to heavy smoking history, however he is currently on Trulicity 3 mg SQ weekly and tolerating it well.  Will continue at this time but we did go over symptoms of pancreatitis to watch out for.  - Specific targets for  A1c; LDL, HDL, and Triglycerides were discussed with the patient.  2) Blood Pressure /Hypertension:  his blood pressure is controlled to target without the use of antihypertensive medications.   3) Lipids/Hyperlipidemia:    Review of his recent lipid panel from 12/28/22 showed controlled LDL at 62, he is not currently on any lipid lowering medications.   4)  Weight/Diet:  his Body mass index is 35.71 kg/m.  -  clearly complicating his diabetes care.   he is a candidate  for weight loss. I discussed with him the fact that loss of 5 - 10% of his  current body weight will have the most impact on his diabetes management.  Exercise, and detailed carbohydrates information provided  -  detailed on discharge  instructions.  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  5) Chronic Care/Health Maintenance: -he is not on ACEI/ARB or Statin medications and is encouraged to initiate and continue to follow up with Ophthalmology, Dentist, Podiatrist at least yearly or according to recommendations, and advised to [place]. I have recommended yearly flu vaccine and pneumonia vaccine at least every 5 years; moderate intensity exercise for up to 150 minutes weekly; and sleep for at least 7 hours a day.  - he is advised to maintain close follow up with Leonie Douglas, MD for primary care needs, as well as his other providers for optimal and coordinated care.   - Time spent in this patient care: 60 min, of which > 50% was spent in counseling him about his diabetes and the rest reviewing his blood glucose logs, discussing his hypoglycemia and hyperglycemia episodes, reviewing his current and previous labs/studies (including abstraction from other facilities) and medications doses and developing a long term treatment plan based on the latest standards of care/guidelines; and documenting his care.    Please refer to Patient Instructions for Blood Glucose Monitoring and Insulin/Medications Dosing Guide" in media tab for additional information. Please also refer to "Patient Self Inventory" in the Media tab for reviewed elements of pertinent patient history.  Venia Minks participated in the discussions, expressed understanding, and voiced agreement with the above plans.  All questions were answered to his satisfaction. he is encouraged to contact clinic should he have any questions or concerns prior to his return visit.     Follow up plan: - Return in about 2 months (around 03/20/2023) for Diabetes F/U with A1c in office, No previsit labs, Bring meter and logs.    Rayetta Pigg, [facility] [address] [place], [place] [address] Phone: [phone] Fax:  [phone]  01/18/2023, 4:49 PM

## 2023-01-18 NOTE — Addendum Note (Signed)
Addended by: Brita Romp on: 01/18/2023 04:50 PM   Modules accepted: Orders

## 2023-01-18 NOTE — Patient Instructions (Signed)

## 2023-02-18 ENCOUNTER — Ambulatory Visit (INDEPENDENT_AMBULATORY_CARE_PROVIDER_SITE_OTHER): Payer: Medicaid Other | Admitting: Urology

## 2023-02-18 VITALS — BP 118/75 | HR 71

## 2023-02-18 DIAGNOSIS — Z3009 Encounter for other general counseling and advice on contraception: Secondary | ICD-10-CM

## 2023-02-18 MED ORDER — DIAZEPAM 10 MG PO TABS: 10.0000 mg | ORAL_TABLET | Freq: Once | ORAL | 0 refills | Status: AC

## 2023-02-18 NOTE — Progress Notes (Unsigned)
02/18/2023 11:09 AM   Isaac Weiss 1981-08-04 [id number]  Referring provider: Wilmon Pali, FNP [address] [place],  [place] [address]  Desires sterilization   HPI: Isaac Weiss is a 41yo here for evaluation of vasectomy. No prior scrotal surgeries. He has 11 children.    PMH: Past Medical History:  Diagnosis Date   Anxiety    Phreesia 07/27/2020   Arthritis    Phreesia 07/27/2020   Asthma    COPD (chronic obstructive pulmonary disease) ([facility])    Depression    Phreesia 07/27/2020   Diabetes mellitus without complication ([facility])    Hyperlipidemia    Phreesia 07/27/2020   Neuromuscular disorder ([facility])    Phreesia 07/27/2020   Sleep apnea    Phreesia 07/27/2020    Surgical History: No past surgical history on file.  Home Medications:  Allergies as of 02/18/2023   No Known Allergies      Medication List        Accurate as of February 18, 2023 11:09 AM. If you have any questions, ask your nurse or doctor.          Accu-Chek FastClix Lancets Misc USE TO CHECK BLOOD SUGAR FOUR TIMES DAILY BEFORE MEALS AND AT BEDTIME   Accu-Chek Guide test strip Generic drug: glucose blood USE TO TEST BLOOD SUGAR THREE TIMES DAILY   Dexcom G7 Sensor Misc Inject 1 Application into the skin as directed. Change sensor every 10 days as directed.   Flovent HFA 110 MCG/ACT inhaler Generic drug: fluticasone INHALE TWO PUFFS BY MOUTH TWICE DAILY   FLUoxetine 40 MG capsule Commonly known as: PROZAC TAKE ONE CAPSULE BY MOUTH EVERY DAY   hydrOXYzine 25 MG tablet Commonly known as: ATARAX Take 25 mg by mouth 3 (three) times daily as needed.   insulin glargine 100 UNIT/ML injection Commonly known as: LANTUS Inject 0.3 mLs (30 Units total) into the skin at bedtime.   Insulin Syringe 27G X 1/2" 1 ML Misc Inject 10 Units into the skin 3 (three) times daily before meals.   SURE COMFORT INS SYR 1CC/28G 28G X 1/2" 1 ML Misc Generic drug: INS SYRINGE/NEEDLE 1CC/28G USE WITH  INSULIN THREE TIMES DAILY BEFORE MEALS   Latuda 20 MG Tabs tablet Generic drug: lurasidone Take 20 mg by mouth daily.   lithium carbonate 450 MG ER tablet Commonly known as: ESKALITH Take 900 mg by mouth daily.   metFORMIN 500 MG 24 hr tablet Commonly known as: GLUCOPHAGE-XR Take 2 tablets (1,000 mg total) by mouth 2 (two) times daily with a meal.   mupirocin ointment 2 % Commonly known as: BACTROBAN Apply 1 Application topically 2 (two) times daily.   neomycin-polymyxin-hydrocortisone OTIC solution Commonly known as: CORTISPORIN Place 3 drops into the left ear 4 (four) times daily. X 5-7 days   Trulicity 3 MG/0.5ML Sopn Generic drug: Dulaglutide Inject 3 mg as directed once a week.   Ventolin HFA 108 (90 Base) MCG/ACT inhaler Generic drug: albuterol INHALE TWO PUFFS BY MOUTH EVERY 6 HOURS AS NEEDED FOR wheezing, SHORTNESS OF BREATH        Allergies: No Known Allergies  Family History: No family history on file.  Social History:  reports that he has been smoking cigarettes. He has been smoking an average of 1 pack per day. He has never used smokeless tobacco. He reports that he does not currently use alcohol. He reports that he does not use drugs.  ROS: All other review of systems were reviewed and are  negative except what is noted above in HPI  Physical Exam: BP 118/75   Pulse 71   Constitutional:  Alert and oriented, No acute distress. HEENT: [name], moist mucus membranes.  Trachea midline, no masses. Cardiovascular: No clubbing, cyanosis, or edema. Respiratory: Normal respiratory effort, no increased work of breathing. GI: Abdomen is soft, nontender, nondistended, no abdominal masses GU: No CVA tenderness. Circumcised phallus. No masses/lesions on penis, testis, scrotum. Prostate 40g smooth no nodules no induration.  Lymph: No cervical or inguinal lymphadenopathy. Skin: No rashes, bruises or suspicious lesions. Neurologic: Grossly intact, no focal deficits,  moving all 4 extremities. Psychiatric: Normal mood and affect.  Laboratory Data: Lab Results  Component Value Date   WBC 8.8 10/21/2021   HGB 16.5 10/21/2021   HCT 48.4 10/21/2021   MCV 97 10/21/2021   PLT 292 10/21/2021    Lab Results  Component Value Date   CREATININE 0.8 12/28/2022    No results found for: "PSA"  No results found for: "TESTOSTERONE"  Lab Results  Component Value Date   HGBA1C 10.2 12/28/2022    Urinalysis    Component Value Date/Time   COLORURINE STRAW (A) 09/02/2019 2216   APPEARANCEUR CLEAR 09/02/2019 2216   LABSPEC 1.026 09/02/2019 2216   PHURINE 7.0 09/02/2019 2216   GLUCOSEU >=500 (A) 09/02/2019 2216   HGBUR NEGATIVE 09/02/2019 2216   BILIRUBINUR NEGATIVE 09/02/2019 2216   KETONESUR NEGATIVE 09/02/2019 2216   PROTEINUR NEGATIVE 09/02/2019 2216   NITRITE NEGATIVE 09/02/2019 2216   LEUKOCYTESUR NEGATIVE 09/02/2019 2216    Lab Results  Component Value Date   LABMICR 41.5 04/22/2022   BACTERIA NONE SEEN 09/02/2019    Pertinent Imaging: *** No results found for this or any previous visit.  No results found for this or any previous visit.  No results found for this or any previous visit.  No results found for this or any previous visit.  No results found for this or any previous visit.  No valid procedures specified. No results found for this or any previous visit.  No results found for this or any previous visit.   Assessment & Plan:    1. Vasectomy evaluation ***   No follow-ups on file.  Wilkie Aye, MD  [facility] [place]

## 2023-02-22 ENCOUNTER — Encounter: Payer: Self-pay | Admitting: Urology

## 2023-02-22 NOTE — Patient Instructions (Signed)
Vasectomy Vasectomy is a procedure in which the vas deferens is cut and then tied or burned (cauterized). The vas deferens is a tube that carries sperm from the testicle to the part of the body that drains urine from the bladder (urethra). This procedure blocks sperm from going through the vas deferens and penis during ejaculation. This ensures that sperm does not go into the vagina during sex. Vasectomy does not affect sexual desire or performance and does not prevent sexually transmitted infections. Vasectomy is considered a permanent and very effective form of birth control (contraception). The decision to have a vasectomy should not be made during a stressful time, such as after the loss of a pregnancy or a divorce. You and your partner should decide on whether to have a vasectomy when you are sure that you do not want children in the future. Tell a health care provider about: Any allergies you have. All medicines you are taking, including vitamins, herbs, eye drops, creams, and over-the-counter medicines. Any problems you or family members have had with anesthetic medicines. Any blood disorders you have. Any surgeries you have had. Any medical conditions you have. What are the risks? Generally, this is a safe procedure. However, problems may occur, including: Infection. Bleeding and swelling of the scrotum. The scrotum is the sac that contains the testicles, blood vessels, and structures that help deliver sperm and semen. Allergic reactions to medicines. Failure of the procedure to prevent pregnancy. There is a very small chance that the tied or cauterized ends of the vas deferens may reconnect (recanalization). If this happens, you could still make a woman pregnant. Pain in the scrotum that continues after you heal from the procedure. What happens before the procedure? Medicines Ask your health care provider about: Changing or stopping your regular medicines. This is especially important if  you are taking diabetes medicines or blood thinners. Taking medicines such as aspirin and ibuprofen. These medicines can thin your blood. Do not take these medicines unless your health care provider tells you to take them. Taking over-the-counter medicines, vitamins, herbs, and supplements. You may be told to take a medicine to help you relax (sedative) a few hours before the procedure. General instructions Do not use any products that contain nicotine or tobacco for at least 4 weeks before the procedure. These products include cigarettes, e-cigarettes, and chewing tobacco. If you need help quitting, ask your health care provider. Plan to have a responsible adult take you home from the hospital or clinic. If you will be going home right after the procedure, plan to have a responsible adult care for you for the time you are told. This is important. Ask your health care provider: How your surgery site will be marked. What steps will be taken to help prevent infection. These steps may include: Removing hair at the surgery site. Washing skin with a germ-killing soap. Taking antibiotic medicine. What happens during the procedure?  You will be given one or more of the following: A sedative, unless you were told to take this a few hours before the procedure. A medicine to numb the area (local anesthetic). Your health care provider will feel, or palpate, for your vas deferens. To reach the vas deferens, one of two methods may be used: A very small incision may be made in your scrotum. A punctured opening may be made in your scrotum, without an incision. Your vas deferens will be pulled out of your scrotum and cut. Then, the vas deferens will be closed   in one of two ways: Tied at the ends. Cauterized at the ends to seal them off. The vas deferens will be put back into your scrotum. The incision or puncture opening will be closed with absorbable stitches (sutures). The sutures will eventually  dissolve and will not need to be removed after the procedure. The procedure will be repeated on the other side of your scrotum. The procedure may vary among health care providers and hospitals. What happens after the procedure? You will be monitored to make sure that you do not have problems. You will be asked not to ejaculate for at least 1 week after the procedure, or for as long as you are told. You will need to use a different form of contraception for 2-4 months after the procedure, until you have test results confirming that there are no sperm in your semen. You may be given scrotal support to wear, such as a jockstrap or underwear with a supportive pouch. If you were given a sedative during the procedure, it can affect you for several hours. Do not drive or operate machinery until your health care provider says that it is safe. Summary Vasectomy blocks sperm from being released during ejaculation. This procedure is considered a permanent and very effective form of birth control. Your scrotum will be numbed with medicine (local anesthetic) for the procedure. After the procedure, you will be asked not to ejaculate for at least 1 week, or for as long as you are told. You will also need to use a different form of contraception until your test results confirm that there are no sperm in your semen. This information is not intended to replace advice given to you by your health care provider. Make sure you discuss any questions you have with your health care provider. Document Revised: 03/06/2020 Document Reviewed: 03/06/2020 Elsevier Patient Education  2023 Elsevier Inc.  

## 2023-03-22 ENCOUNTER — Ambulatory Visit (INDEPENDENT_AMBULATORY_CARE_PROVIDER_SITE_OTHER): Payer: Medicaid Other | Admitting: Nurse Practitioner

## 2023-03-22 ENCOUNTER — Encounter: Payer: Self-pay | Admitting: Nurse Practitioner

## 2023-03-22 VITALS — BP 102/72 | HR 88 | Ht 69.0 in | Wt 229.0 lb

## 2023-03-22 DIAGNOSIS — E1165 Type 2 diabetes mellitus with hyperglycemia: Secondary | ICD-10-CM | POA: Diagnosis not present

## 2023-03-22 DIAGNOSIS — Z794 Long term (current) use of insulin: Secondary | ICD-10-CM | POA: Diagnosis not present

## 2023-03-22 DIAGNOSIS — Z7984 Long term (current) use of oral hypoglycemic drugs: Secondary | ICD-10-CM

## 2023-03-22 LAB — POCT GLYCOSYLATED HEMOGLOBIN (HGB A1C): Hemoglobin A1C: 7 % — AB (ref 4.0–5.6)

## 2023-03-22 MED ORDER — LANTUS SOLOSTAR 100 UNIT/ML ~~LOC~~ SOPN: 30.0000 [IU] | PEN_INJECTOR | Freq: Every day | SUBCUTANEOUS | 3 refills | Status: DC

## 2023-03-22 MED ORDER — METFORMIN HCL ER 500 MG PO TB24: 1000.0000 mg | ORAL_TABLET | Freq: Every day | ORAL | 3 refills | Status: DC

## 2023-03-22 MED ORDER — DEXCOM G7 RECEIVER DEVI: 1.0000 | Freq: Once | 0 refills | Status: DC

## 2023-03-22 NOTE — Progress Notes (Unsigned)
Endocrinology Follow Up Note       03/23/2023, 7:13 AM   Subjective:    Patient ID: Isaac Weiss, male    DOB: 1981/01/03.  Isaac Weiss is being seen in follow up after being seen in consultation for management of currently uncontrolled symptomatic diabetes requested by  Waldon Reining, MD.   Past Medical History:  Diagnosis Date   Anxiety    Phreesia 07/27/2020   Arthritis    Phreesia 07/27/2020   Asthma    COPD (chronic obstructive pulmonary disease) (HCC)    Depression    Phreesia 07/27/2020   Diabetes mellitus without complication (HCC)    Hyperlipidemia    Phreesia 07/27/2020   Neuromuscular disorder (HCC)    Phreesia 07/27/2020   Sleep apnea    Phreesia 07/27/2020    History reviewed. No pertinent surgical history.  Social History   Socioeconomic History   Marital status: Married    Spouse name: Not on file   Number of children: Not on file   Years of education: Not on file   Highest education level: Not on file  Occupational History   Not on file  Tobacco Use   Smoking status: Every Day    Packs/day: 1    Types: Cigarettes   Smokeless tobacco: Never  Vaping Use   Vaping Use: Never used  Substance and Sexual Activity   Alcohol use: Not Currently   Drug use: Never   Sexual activity: Not on file  Other Topics Concern   Not on file  Social History Narrative   Not on file   Social Determinants of Health   Financial Resource Strain: Not on file  Food Insecurity: Not on file  Transportation Needs: Not on file  Physical Activity: Not on file  Stress: Not on file  Social Connections: Not on file    History reviewed. No pertinent family history.  Outpatient Encounter Medications as of 03/22/2023  Medication Sig   Accu-Chek FastClix Lancets MISC USE TO CHECK BLOOD SUGAR FOUR TIMES DAILY BEFORE MEALS AND AT BEDTIME   ACCU-CHEK GUIDE test strip USE TO TEST BLOOD SUGAR  THREE TIMES DAILY   albuterol (VENTOLIN HFA) 108 (90 Base) MCG/ACT inhaler INHALE TWO PUFFS BY MOUTH EVERY 6 HOURS AS NEEDED FOR wheezing, SHORTNESS OF BREATH   Continuous Blood Gluc Sensor (DEXCOM G7 SENSOR) MISC Inject 1 Application into the skin as directed. Change sensor every 10 days as directed.   [EXPIRED] Continuous Glucose Receiver (DEXCOM G7 RECEIVER) DEVI 1 Device by Does not apply route once for 1 dose.   Dulaglutide (TRULICITY) 3 MG/0.5ML SOPN Inject 3 mg as directed once a week.   FLOVENT HFA 110 MCG/ACT inhaler INHALE TWO PUFFS BY MOUTH TWICE DAILY   FLUoxetine (PROZAC) 40 MG capsule TAKE ONE CAPSULE BY MOUTH EVERY DAY   insulin glargine (LANTUS SOLOSTAR) 100 UNIT/ML Solostar Pen Inject 30 Units into the skin at bedtime.   Insulin Syringe 27G X 1/2" 1 ML MISC Inject 10 Units into the skin 3 (three) times daily before meals.   mupirocin ointment (BACTROBAN) 2 % Apply 1 Application topically 2 (two) times daily.   SURE COMFORT INS SYR 1CC/28G 28G X 1/2" 1  ML MISC USE WITH INSULIN THREE TIMES DAILY BEFORE MEALS   [DISCONTINUED] insulin glargine (LANTUS) 100 UNIT/ML injection Inject 0.3 mLs (30 Units total) into the skin at bedtime.   [DISCONTINUED] metFORMIN (GLUCOPHAGE-XR) 500 MG 24 hr tablet Take 2 tablets (1,000 mg total) by mouth 2 (two) times daily with a meal.   hydrOXYzine (ATARAX) 25 MG tablet Take 25 mg by mouth 3 (three) times daily as needed. (Patient not taking: Reported on 04/22/2022)   lithium carbonate (ESKALITH) 450 MG CR tablet Take 900 mg by mouth daily. (Patient not taking: Reported on 04/22/2022)   lurasidone (LATUDA) 20 MG TABS tablet Take 20 mg by mouth daily. (Patient not taking: Reported on 04/22/2022)   metFORMIN (GLUCOPHAGE-XR) 500 MG 24 hr tablet Take 2 tablets (1,000 mg total) by mouth daily with breakfast.   neomycin-polymyxin-hydrocortisone (CORTISPORIN) OTIC solution Place 3 drops into the left ear 4 (four) times daily. X 5-7 days (Patient not taking: Reported  on 04/22/2022)   No facility-administered encounter medications on file as of 03/22/2023.    ALLERGIES: No Known Allergies  VACCINATION STATUS: Immunization History  Administered Date(s) Administered   Influenza,inj,Quad PF,6+ Mos 09/12/2019, 07/30/2020, 10/21/2021, 08/10/2022   PFIZER(Purple Top)SARS-COV-2 Vaccination 01/31/2020, 02/25/2020, 11/25/2020   Tdap 02/27/2022    Diabetes He presents for his follow-up diabetic visit. He has type 2 diabetes mellitus. Onset time: Diagnosed at approx age of 32. His disease course has been improving. There are no hypoglycemic associated symptoms. Associated symptoms include blurred vision, fatigue, polydipsia, polyuria and weight loss. There are no hypoglycemic complications. Symptoms are stable. Diabetic complications include retinopathy. Risk factors for coronary artery disease include diabetes mellitus, family history, obesity, male sex, tobacco exposure and stress. Current diabetic treatment includes insulin injections and oral agent (monotherapy) (and Trulicity). He is compliant with treatment most of the time. His weight is decreasing steadily. He is following a generally healthy diet. When asked about meal planning, he reported none. He has not had a previous visit with a dietitian. He participates in exercise intermittently. His home blood glucose trend is decreasing steadily. His overall blood glucose range is 140-180 mg/dl. (He presents today with his CGM showing at goal glycemic profile overall.  His POCT A1c today is 7%, improving from last visit of 10.2%.  Analysis of his CGM shows TIR 65%, TAR 35%, TBR 0% with a GMI of 7.4%.  He notes he does forget his Lantus insulin sometimes at night.) An ACE inhibitor/angiotensin II receptor blocker is not being taken. He does not see a podiatrist.Eye exam is not current.     Review of systems  Constitutional: + steadily decreasing body weight, current Body mass index is 33.82 kg/m., + fatigue, no  subjective hyperthermia, no subjective hypothermia Eyes: + blurry vision (+ retinopathy), no xerophthalmia ENT: no sore throat, no nodules palpated in throat, no dysphagia/odynophagia, no hoarseness Cardiovascular: no chest pain, no shortness of breath, no palpitations, no leg swelling Respiratory: no cough, no shortness of breath Gastrointestinal: no nausea/vomiting/diarrhea Musculoskeletal: no muscle/joint aches Skin: no rashes, no hyperemia Neurological: no tremors, no numbness, no tingling, no dizziness Psychiatric: no depression, no anxiety  Objective:     BP 102/72 (BP Location: Right Arm, Patient Position: Sitting, Cuff Size: Large)   Pulse 88   Ht 5\' 9"  (1.753 m)   Wt 229 lb (103.9 kg)   BMI 33.82 kg/m   Wt Readings from Last 3 Encounters:  03/22/23 229 lb (103.9 kg)  01/18/23 241 lb 12.8 oz (109.7 kg)  04/22/22 261 lb 3.2 oz (118.5 kg)     BP Readings from Last 3 Encounters:  03/22/23 102/72  02/18/23 118/75  01/18/23 108/70     Physical Exam- Limited  Constitutional:  Body mass index is 33.82 kg/m. , not in acute distress, normal state of mind Eyes:  EOMI, no exophthalmos Musculoskeletal: no gross deformities, strength intact in all four extremities, no gross restriction of joint movements Skin:  no rashes, no hyperemia Neurological: no tremor with outstretched hands   Diabetic Foot Exam - Simple   No data filed    CMP ( most recent) CMP     Component Value Date/Time   NA 142 04/22/2022 1625   K 4.4 04/22/2022 1625   CL 102 04/22/2022 1625   CO2 26 04/22/2022 1625   GLUCOSE 213 (H) 04/22/2022 1625   GLUCOSE 514 (HH) 09/07/2019 2035   BUN 8 12/28/2022 0000   CREATININE 0.8 12/28/2022 0000   CREATININE 0.84 04/22/2022 1625   CALCIUM 9.0 04/22/2022 1625   PROT 6.7 04/22/2022 1625   ALBUMIN 4.3 04/22/2022 1625   AST 16 04/22/2022 1625   ALT 22 04/22/2022 1625   ALKPHOS 92 04/22/2022 1625   BILITOT 0.5 04/22/2022 1625   GFRNONAA 103 09/17/2020  1449   GFRAA 119 09/17/2020 1449     Diabetic Labs (most recent): Lab Results  Component Value Date   HGBA1C 7.0 (A) 03/22/2023   HGBA1C 10.2 12/28/2022   HGBA1C 7.8 (H) 04/22/2022     Lipid Panel ( most recent) Lipid Panel     Component Value Date/Time   CHOL 149 04/22/2022 1625   TRIG 142 12/28/2022 0000   HDL 31 (L) 04/22/2022 1625   CHOLHDL 4.8 04/22/2022 1625   LDLCALC 62 12/28/2022 0000   LDLCALC 83 04/22/2022 1625   LABVLDL 35 04/22/2022 1625      Lab Results  Component Value Date   TSH 1.39 12/28/2022   TSH 3.010 10/21/2021   TSH 4.580 (H) 06/17/2021   TSH 6.680 (H) 09/17/2020   FREET4 1.22 10/21/2021   FREET4 1.10 09/17/2020           Assessment & Plan:   1) Type 2 diabetes mellitus with hyperglycemia, with long-term current use of insulin (HCC)  He presents today with his CGM showing at goal glycemic profile overall.  His POCT A1c today is 7%, improving from last visit of 10.2%.  Analysis of his CGM shows TIR 65%, TAR 35%, TBR 0% with a GMI of 7.4%.  He notes he does forget his Lantus insulin sometimes at night.  - Isaac Weiss has currently uncontrolled symptomatic type 2 DM since 42 years of age.   -Recent labs reviewed.  - I had a long discussion with him about the progressive nature of diabetes and the pathology behind its complications. -his diabetes is complicated by retinopathy and he remains at a high risk for more acute and chronic complications which include CAD, CVA, CKD, retinopathy, and neuropathy. These are all discussed in detail with him.  The following Lifestyle Medicine recommendations according to American College of Lifestyle Medicine [facility]) were discussed and offered to patient and he agrees to start the journey:  A. Whole Foods, Plant-based plate comprising of fruits and vegetables, plant-based proteins, whole-grain carbohydrates was discussed in detail with the patient.   A list for source of those nutrients were also  provided to the patient.  Patient will use only water or unsweetened tea for hydration. B.  The need to stay  away from risky substances including alcohol, smoking; obtaining 7 to 9 hours of restorative sleep, at least 150 minutes of moderate intensity exercise weekly, the importance of healthy social connections,  and stress reduction techniques were discussed. C.  A full color page of  Calorie density of various food groups per pound showing examples of each food groups was provided to the patient.  - Nutritional counseling repeated at each appointment due to patients tendency to fall back in to old habits.  - The patient admits there is a room for improvement in their diet and drink choices. -  Suggestion is made for the patient to avoid simple carbohydrates from their diet including Cakes, Sweet Desserts / Pastries, Ice Cream, Soda (diet and regular), Sweet Tea, Candies, Chips, Cookies, Sweet Pastries, Store Bought Juices, Alcohol in Excess of 1-2 drinks a day, Artificial Sweeteners, Coffee Creamer, and "Sugar-free" Products. This will help patient to have stable blood glucose profile and potentially avoid unintended weight gain.   - I encouraged the patient to switch to unprocessed or minimally processed complex starch and increased protein intake (animal or plant source), fruits, and vegetables.   - Patient is advised to stick to a routine mealtimes to eat 3 meals a day and avoid unnecessary snacks (to snack only to correct hypoglycemia).  - I have approached him with the following individualized plan to manage his diabetes and patient agrees:   -He is advised to continue Lantus 30 units SQ nightly, Trulicity 3 mg SQ weekly, and will change his Metformin to 1000 mg ER daily with breakfast (was on regular strength 1000 mg BID and he tends to forget his second pill).  -he is encouraged to continue monitoring glucose at least twice daily, before breakfast and before bed (using his CGM), and to  call the clinic if he has readings less than 70 or above 300 for 3 tests in a row.  - he is warned not to take insulin without proper monitoring per orders. - Adjustment parameters are given to him for hypo and hyperglycemia in writing.  - Specific targets for  A1c; LDL, HDL, and Triglycerides were discussed with the patient.  2) Blood Pressure /Hypertension:  his blood pressure is controlled to target without the use of antihypertensive medications.   3) Lipids/Hyperlipidemia:    Review of his recent lipid panel from 12/28/22 showed controlled LDL at 62, he is not currently on any lipid lowering medications.   4)  Weight/Diet:  his Body mass index is 33.82 kg/m.  -  clearly complicating his diabetes care.   he is a candidate for weight loss. I discussed with him the fact that loss of 5 - 10% of his  current body weight will have the most impact on his diabetes management.  Exercise, and detailed carbohydrates information provided  -  detailed on discharge instructions.  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  5) Chronic Care/Health Maintenance: -he is not on ACEI/ARB or Statin medications and is encouraged to initiate and continue to follow up with Ophthalmology, Dentist, Podiatrist at least yearly or according to recommendations, and advised to QUIT SMOKING. I have recommended yearly flu vaccine and pneumonia vaccine at least every 5 years; moderate intensity exercise for up to 150 minutes weekly; and sleep for at least 7 hours a day.  - he is advised to maintain close follow up with Waldon Reining, MD for primary care needs, as well as his other providers for optimal and coordinated care.  I spent  42  minutes in the care of the patient today including review of labs from CMP, Lipids, Thyroid Function, Hematology (current and previous including abstractions from other facilities); face-to-face time discussing  his blood glucose readings/logs, discussing hypoglycemia and hyperglycemia  episodes and symptoms, medications doses, his options of short and long term treatment based on the latest standards of care / guidelines;  discussion about incorporating lifestyle medicine;  and documenting the encounter. Risk reduction counseling performed per USPSTF guidelines to reduce obesity and cardiovascular risk factors.     Please refer to Patient Instructions for Blood Glucose Monitoring and Insulin/Medications Dosing Guide"  in media tab for additional information. Please  also refer to " Patient Self Inventory" in the Media  tab for reviewed elements of pertinent patient history.  Isaac Weiss participated in the discussions, expressed understanding, and voiced agreement with the above plans.  All questions were answered to his satisfaction. he is encouraged to contact clinic should he have any questions or concerns prior to his return visit.     Follow up plan: - Return in about 3 months (around 06/22/2023) for Diabetes F/U with A1c in office, No previsit labs, Bring meter and logs.   Isaac Weiss, [facility] [address] [place], [place] [address] Phone: [phone] Fax: [phone]  03/23/2023, 7:13 AM

## 2023-03-22 NOTE — Patient Instructions (Signed)

## 2023-03-26 ENCOUNTER — Other Ambulatory Visit (HOSPITAL_COMMUNITY): Payer: Self-pay

## 2023-04-05 ENCOUNTER — Other Ambulatory Visit: Payer: Self-pay | Admitting: Internal Medicine

## 2023-04-05 ENCOUNTER — Other Ambulatory Visit: Payer: Self-pay | Admitting: Physician Assistant

## 2023-04-05 ENCOUNTER — Encounter: Payer: Self-pay | Admitting: Nurse Practitioner

## 2023-04-05 DIAGNOSIS — L0882 Omphalitis not of newborn: Secondary | ICD-10-CM

## 2023-04-05 DIAGNOSIS — H66002 Acute suppurative otitis media without spontaneous rupture of ear drum, left ear: Secondary | ICD-10-CM

## 2023-04-06 ENCOUNTER — Telehealth: Payer: Self-pay

## 2023-04-06 ENCOUNTER — Other Ambulatory Visit (HOSPITAL_COMMUNITY): Payer: Self-pay

## 2023-04-06 MED ORDER — "PEN NEEDLES 5/16"" 30G X 8 MM MISC": 6 refills | Status: DC

## 2023-04-06 NOTE — Telephone Encounter (Signed)
Patient Advocate Encounter   Received notification from cMM that prior authorization is required for Dexcom receiver  Submitted: 04/06/23 via [facility] Tracks  Confirmation #:[id number] W

## 2023-04-06 NOTE — Telephone Encounter (Signed)
Patient Advocate Encounter   Received notification from [facility] that prior authorization is required for Dexcom G7 sensor  Submitted: 04/06/23 via [employer] [id number] W

## 2023-04-11 NOTE — Telephone Encounter (Signed)
Can you call his pharmacy and see what is going on?  Why isn't he able to get his meds?

## 2023-04-12 NOTE — Telephone Encounter (Signed)
Per pharmacy tech at [facility] - Patient picked up on 04/08/23 the Lantus , Pen Needles, and the G7 sensors. Patient has refills on these  The pharmacy is working on [employer] , states that they have to order it.

## 2023-04-15 ENCOUNTER — Other Ambulatory Visit (HOSPITAL_COMMUNITY): Payer: Self-pay

## 2023-04-15 NOTE — Telephone Encounter (Signed)
Pharmacy Patient Advocate Encounter  Prior Authorization has been approved, but only for 30 days.   Expires 05/01/2023

## 2023-04-15 NOTE — Telephone Encounter (Signed)
Pharmacy Patient Advocate Encounter  Prior Authorization has been approved, but only for 30 days.   Expires 05/01/2023  

## 2023-04-18 ENCOUNTER — Other Ambulatory Visit (HOSPITAL_COMMUNITY): Payer: Self-pay

## 2023-04-18 NOTE — Telephone Encounter (Signed)
Patient was called and made aware that the sensors had been approved, and only for 30 days.

## 2023-04-18 NOTE — Telephone Encounter (Signed)
Patient was called and message was left, letting him know that the sensor was approved, and only for 30 days.

## 2023-04-25 ENCOUNTER — Encounter: Payer: Medicaid Other | Admitting: Urology

## 2023-04-25 DIAGNOSIS — Z302 Encounter for sterilization: Secondary | ICD-10-CM

## 2023-05-03 ENCOUNTER — Other Ambulatory Visit: Payer: Self-pay | Admitting: Internal Medicine

## 2023-05-03 DIAGNOSIS — E119 Type 2 diabetes mellitus without complications: Secondary | ICD-10-CM

## 2023-05-13 ENCOUNTER — Other Ambulatory Visit: Payer: Self-pay | Admitting: Internal Medicine

## 2023-05-13 DIAGNOSIS — E119 Type 2 diabetes mellitus without complications: Secondary | ICD-10-CM

## 2023-05-16 ENCOUNTER — Telehealth: Payer: Self-pay

## 2023-05-16 NOTE — Telephone Encounter (Signed)
Pharmacy Patient Advocate Encounter   Received notification from CoverMyMeds that prior authorization for Dexcom G7 sensor is required/requested.   Insurance verification completed.   The patient is insured through  [employer]  .    PA submitted to [employer] via CoverMyMeds Key/confirmation #/[id number] Status is pending

## 2023-05-22 ENCOUNTER — Encounter: Payer: Self-pay | Admitting: Nurse Practitioner

## 2023-05-24 ENCOUNTER — Other Ambulatory Visit (HOSPITAL_COMMUNITY): Payer: Self-pay

## 2023-05-24 NOTE — Telephone Encounter (Signed)
Pharmacy Patient Advocate Encounter  Received notification from  [employer]  that Prior Authorization for Dexcom G7 Sensor has been APPROVED from 05/17/23 to 05/16/24.  PA #/Case ID/Reference #:  [id number]  Rx filled 7/16 for a 30 day supply. Next fill date 06/09/23

## 2023-06-23 ENCOUNTER — Encounter: Payer: Self-pay | Admitting: Nurse Practitioner

## 2023-06-23 ENCOUNTER — Ambulatory Visit: Payer: MEDICAID | Admitting: Nurse Practitioner

## 2023-06-23 VITALS — BP 119/72 | HR 71 | Ht 68.0 in | Wt 231.6 lb

## 2023-06-23 DIAGNOSIS — Z7984 Long term (current) use of oral hypoglycemic drugs: Secondary | ICD-10-CM | POA: Diagnosis not present

## 2023-06-23 DIAGNOSIS — E1165 Type 2 diabetes mellitus with hyperglycemia: Secondary | ICD-10-CM | POA: Diagnosis not present

## 2023-06-23 DIAGNOSIS — E11319 Type 2 diabetes mellitus with unspecified diabetic retinopathy without macular edema: Secondary | ICD-10-CM

## 2023-06-23 DIAGNOSIS — Z794 Long term (current) use of insulin: Secondary | ICD-10-CM | POA: Diagnosis not present

## 2023-06-23 LAB — POCT GLYCOSYLATED HEMOGLOBIN (HGB A1C): Hemoglobin A1C: 6.4 % — AB (ref 4.0–5.6)

## 2023-06-23 MED ORDER — GLIPIZIDE ER 5 MG PO TB24: 5.0000 mg | ORAL_TABLET | Freq: Every day | ORAL | 3 refills | Status: DC

## 2023-06-23 NOTE — Progress Notes (Signed)
Endocrinology Follow Up Note       06/23/2023, 4:00 PM   Subjective:    Patient ID: Isaac Weiss, male    DOB: 1981-07-07.  Isaac Weiss is being seen in follow up after being seen in consultation for management of currently uncontrolled symptomatic diabetes requested by  Waldon Reining, MD.   Past Medical History:  Diagnosis Date   Anxiety    Phreesia 07/27/2020   Arthritis    Phreesia 07/27/2020   Asthma    COPD (chronic obstructive pulmonary disease) (HCC)    Depression    Phreesia 07/27/2020   Diabetes mellitus without complication (HCC)    Hyperlipidemia    Phreesia 07/27/2020   Neuromuscular disorder (HCC)    Phreesia 07/27/2020   Sleep apnea    Phreesia 07/27/2020    No past surgical history on file.  Social History   Socioeconomic History   Marital status: Married    Spouse name: Not on file   Number of children: Not on file   Years of education: Not on file   Highest education level: Not on file  Occupational History   Not on file  Tobacco Use   Smoking status: Every Day    Current packs/day: 1.00    Types: Cigarettes   Smokeless tobacco: Never  Vaping Use   Vaping status: Never Used  Substance and Sexual Activity   Alcohol use: Not Currently   Drug use: Never   Sexual activity: Not on file  Other Topics Concern   Not on file  Social History Narrative   Not on file   Social Determinants of Health   Financial Resource Strain: Not on file  Food Insecurity: Not on file  Transportation Needs: Not on file  Physical Activity: Not on file  Stress: Not on file  Social Connections: Unknown (03/04/2022)   Received from [facility]   Social Network    Social Network: Not on file    No family history on file.  Outpatient Encounter Medications as of 06/23/2023  Medication Sig   Accu-Chek FastClix Lancets MISC USE TO CHECK BLOOD SUGAR FOUR TIMES DAILY BEFORE MEALS AND  AT BEDTIME   ACCU-CHEK GUIDE test strip USE TO TEST BLOOD SUGAR THREE TIMES DAILY   albuterol (VENTOLIN HFA) 108 (90 Base) MCG/ACT inhaler INHALE TWO PUFFS BY MOUTH EVERY 6 HOURS AS NEEDED FOR wheezing, SHORTNESS OF BREATH   Continuous Blood Gluc Sensor (DEXCOM G7 SENSOR) MISC Inject 1 Application into the skin as directed. Change sensor every 10 days as directed.   Dulaglutide (TRULICITY) 3 MG/0.5ML SOPN Inject 3 mg as directed once a week.   FLUoxetine (PROZAC) 40 MG capsule TAKE ONE CAPSULE BY MOUTH EVERY DAY   Insulin Pen Needle (PEN NEEDLES 5/16") 30G X 8 MM MISC Use to inject insulin once daily.   Insulin Syringe 27G X 1/2" 1 ML MISC Inject 10 Units into the skin 3 (three) times daily before meals.   metFORMIN (GLUCOPHAGE-XR) 500 MG 24 hr tablet Take 2 tablets (1,000 mg total) by mouth daily with breakfast.   mupirocin ointment (BACTROBAN) 2 % Apply 1 Application topically 2 (two) times daily.   neomycin-polymyxin-hydrocortisone (CORTISPORIN) OTIC solution Place 3  drops into the left ear 4 (four) times daily. X 5-7 days   SURE COMFORT INS SYR 1CC/28G 28G X 1/2" 1 ML MISC USE WITH INSULIN THREE TIMES DAILY BEFORE MEALS   FLOVENT HFA 110 MCG/ACT inhaler INHALE TWO PUFFS BY MOUTH TWICE DAILY (Patient not taking: Reported on 06/23/2023)   hydrOXYzine (ATARAX) 25 MG tablet Take 25 mg by mouth 3 (three) times daily as needed. (Patient not taking: Reported on 04/22/2022)   insulin glargine (LANTUS SOLOSTAR) 100 UNIT/ML Solostar Pen Inject 30 Units into the skin at bedtime. (Patient not taking: Reported on 06/23/2023)   lithium carbonate (ESKALITH) 450 MG CR tablet Take 900 mg by mouth daily. (Patient not taking: Reported on 04/22/2022)   lurasidone (LATUDA) 20 MG TABS tablet Take 20 mg by mouth daily. (Patient not taking: Reported on 04/22/2022)   No facility-administered encounter medications on file as of 06/23/2023.    ALLERGIES: No Known Allergies  VACCINATION STATUS: Immunization History   Administered Date(s) Administered   Influenza,inj,Quad PF,6+ Mos 09/12/2019, 07/30/2020, 10/21/2021, 08/10/2022   PFIZER(Purple Top)SARS-COV-2 Vaccination 01/31/2020, 02/25/2020, 11/25/2020   Tdap 02/27/2022    Diabetes He presents for his follow-up diabetic visit. He has type 2 diabetes mellitus. Onset time: Diagnosed at approx age of 34. His disease course has been improving. There are no hypoglycemic associated symptoms. Associated symptoms include blurred vision, fatigue and weight loss. Pertinent negatives for diabetes include no polydipsia and no polyuria. There are no hypoglycemic complications. Symptoms are improving. Diabetic complications include retinopathy. Risk factors for coronary artery disease include diabetes mellitus, family history, obesity, male sex, tobacco exposure and stress. Current diabetic treatment includes oral agent (monotherapy) (and Trulicity). He is compliant with treatment most of the time. His weight is fluctuating minimally. He is following a generally healthy diet. When asked about meal planning, he reported none. He has not had a previous visit with a dietitian. He participates in exercise intermittently. His home blood glucose trend is decreasing steadily. His overall blood glucose range is 140-180 mg/dl. (He presents today with his CGM showing at goal glycemic profile overall.  His POCT A1c today is 6.4%, improving from last visit of 7%.  Analysis of his CGM shows TIR 69%, TAR 30%, TBR 1% with a GMI of 7.1%.  He stopped taking his Lantus about a month ago due to extreme low glucose readings.  He is asking about re-initiating Glipizide today.) An ACE inhibitor/angiotensin II receptor blocker is not being taken. He does not see a podiatrist.Eye exam is not current.     Review of systems  Constitutional: + steadily decreasing body weight, current Body mass index is 35.21 kg/m., + fatigue, no subjective hyperthermia, no subjective hypothermia Eyes: + blurry vision  (+ retinopathy), no xerophthalmia ENT: no sore throat, no nodules palpated in throat, no dysphagia/odynophagia, no hoarseness Cardiovascular: no chest pain, no shortness of breath, no palpitations, no leg swelling Respiratory: no cough, no shortness of breath Gastrointestinal: no nausea/vomiting/diarrhea Musculoskeletal: no muscle/joint aches Skin: no rashes, no hyperemia Neurological: no tremors, no numbness, no tingling, no dizziness Psychiatric: no depression, no anxiety  Objective:     BP 119/72 (BP Location: Left Arm, Patient Position: Sitting, Cuff Size: Large)   Pulse 71   Ht 5\' 8"  (1.727 m)   Wt 231 lb 9.6 oz (105.1 kg)   BMI 35.21 kg/m   Wt Readings from Last 3 Encounters:  06/23/23 231 lb 9.6 oz (105.1 kg)  03/22/23 229 lb (103.9 kg)  01/18/23 241 lb 12.8 oz (  109.7 kg)     BP Readings from Last 3 Encounters:  06/23/23 119/72  03/22/23 102/72  02/18/23 118/75     Physical Exam- Limited  Constitutional:  Body mass index is 35.21 kg/m. , not in acute distress, normal state of mind Eyes:  EOMI, no exophthalmos Musculoskeletal: no gross deformities, strength intact in all four extremities, no gross restriction of joint movements Skin:  no rashes, no hyperemia Neurological: no tremor with outstretched hands   Diabetic Foot Exam - Simple   No data filed    CMP ( most recent) CMP     Component Value Date/Time   NA 142 04/22/2022 1625   K 4.4 04/22/2022 1625   CL 102 04/22/2022 1625   CO2 26 04/22/2022 1625   GLUCOSE 213 (H) 04/22/2022 1625   GLUCOSE 514 (HH) 09/07/2019 2035   BUN 8 12/28/2022 0000   CREATININE 0.8 12/28/2022 0000   CREATININE 0.84 04/22/2022 1625   CALCIUM 9.0 04/22/2022 1625   PROT 6.7 04/22/2022 1625   ALBUMIN 4.3 04/22/2022 1625   AST 16 04/22/2022 1625   ALT 22 04/22/2022 1625   ALKPHOS 92 04/22/2022 1625   BILITOT 0.5 04/22/2022 1625   GFRNONAA 103 09/17/2020 1449   GFRAA 119 09/17/2020 1449     Diabetic Labs (most  recent): Lab Results  Component Value Date   HGBA1C 6.4 (A) 06/23/2023   HGBA1C 7.0 (A) 03/22/2023   HGBA1C 10.2 12/28/2022     Lipid Panel ( most recent) Lipid Panel     Component Value Date/Time   CHOL 149 04/22/2022 1625   TRIG 142 12/28/2022 0000   HDL 31 (L) 04/22/2022 1625   CHOLHDL 4.8 04/22/2022 1625   LDLCALC 62 12/28/2022 0000   LDLCALC 83 04/22/2022 1625   LABVLDL 35 04/22/2022 1625      Lab Results  Component Value Date   TSH 1.39 12/28/2022   TSH 3.010 10/21/2021   TSH 4.580 (H) 06/17/2021   TSH 6.680 (H) 09/17/2020   FREET4 1.22 10/21/2021   FREET4 1.10 09/17/2020           Assessment & Plan:   1) Type 2 diabetes mellitus with hyperglycemia, with long-term current use of insulin (HCC)  He presents today with his CGM showing at goal glycemic profile overall.  His POCT A1c today is 6.4%, improving from last visit of 7%.  Analysis of his CGM shows TIR 69%, TAR 30%, TBR 1% with a GMI of 7.1%.  He stopped taking his Lantus about a month ago due to extreme low glucose readings.  He is asking about re-initiating Glipizide today.  - Rawn Leverette has currently uncontrolled symptomatic type 2 DM since 42 years of age.   -Recent labs reviewed.  - I had a long discussion with him about the progressive nature of diabetes and the pathology behind its complications. -his diabetes is complicated by retinopathy and he remains at a high risk for more acute and chronic complications which include CAD, CVA, CKD, retinopathy, and neuropathy. These are all discussed in detail with him.  The following Lifestyle Medicine recommendations according to American College of Lifestyle Medicine [facility]) were discussed and offered to patient and he agrees to start the journey:  A. Whole Foods, Plant-based plate comprising of fruits and vegetables, plant-based proteins, whole-grain carbohydrates was discussed in detail with the patient.   A list for source of those nutrients were  also provided to the patient.  Patient will use only water or unsweetened tea for  hydration. B.  The need to stay away from risky substances including alcohol, smoking; obtaining 7 to 9 hours of restorative sleep, at least 150 minutes of moderate intensity exercise weekly, the importance of healthy social connections,  and stress reduction techniques were discussed. C.  A full color page of  Calorie density of various food groups per pound showing examples of each food groups was provided to the patient.  - Nutritional counseling repeated at each appointment due to patients tendency to fall back in to old habits.  - The patient admits there is a room for improvement in their diet and drink choices. -  Suggestion is made for the patient to avoid simple carbohydrates from their diet including Cakes, Sweet Desserts / Pastries, Ice Cream, Soda (diet and regular), Sweet Tea, Candies, Chips, Cookies, Sweet Pastries, Store Bought Juices, Alcohol in Excess of 1-2 drinks a day, Artificial Sweeteners, Coffee Creamer, and "Sugar-free" Products. This will help patient to have stable blood glucose profile and potentially avoid unintended weight gain.   - I encouraged the patient to switch to unprocessed or minimally processed complex starch and increased protein intake (animal or plant source), fruits, and vegetables.   - Patient is advised to stick to a routine mealtimes to eat 3 meals a day and avoid unnecessary snacks (to snack only to correct hypoglycemia).  - I have approached him with the following individualized plan to manage his diabetes and patient agrees:   -He is advised to continue Trulicity 3 mg SQ weekly, Metformin 1000 mg ER daily with breakfast, and restart Glipizide 5 mg XL daily with breakfast to help with postprandial spikes.  -he is encouraged to continue monitoring glucose at least twice daily, before breakfast and before bed (using his CGM), and to call the clinic if he has readings less  than 70 or above 300 for 3 tests in a row.  - he is warned not to take insulin without proper monitoring per orders. - Adjustment parameters are given to him for hypo and hyperglycemia in writing.  - Specific targets for  A1c; LDL, HDL, and Triglycerides were discussed with the patient.  2) Blood Pressure /Hypertension:  his blood pressure is controlled to target without the use of antihypertensive medications.   3) Lipids/Hyperlipidemia:    Review of his recent lipid panel from 12/28/22 showed controlled LDL at 62, he is not currently on any lipid lowering medications.   4)  Weight/Diet:  his Body mass index is 35.21 kg/m.  -  clearly complicating his diabetes care.   he is a candidate for weight loss. I discussed with him the fact that loss of 5 - 10% of his  current body weight will have the most impact on his diabetes management.  Exercise, and detailed carbohydrates information provided  -  detailed on discharge instructions.  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  5) Chronic Care/Health Maintenance: -he is not on ACEI/ARB or Statin medications and is encouraged to initiate and continue to follow up with Ophthalmology, Dentist, Podiatrist at least yearly or according to recommendations, and advised to QUIT SMOKING. I have recommended yearly flu vaccine and pneumonia vaccine at least every 5 years; moderate intensity exercise for up to 150 minutes weekly; and sleep for at least 7 hours a day.  - he is advised to maintain close follow up with Waldon Reining, MD for primary care needs, as well as his other providers for optimal and coordinated care.     I spent  36  minutes in the care of the patient today including review of labs from CMP, Lipids, Thyroid Function, Hematology (current and previous including abstractions from other facilities); face-to-face time discussing  his blood glucose readings/logs, discussing hypoglycemia and hyperglycemia episodes and symptoms, medications  doses, his options of short and long term treatment based on the latest standards of care / guidelines;  discussion about incorporating lifestyle medicine;  and documenting the encounter. Risk reduction counseling performed per USPSTF guidelines to reduce obesity and cardiovascular risk factors.     Please refer to Patient Instructions for Blood Glucose Monitoring and Insulin/Medications Dosing Guide"  in media tab for additional information. Please  also refer to " Patient Self Inventory" in the Media  tab for reviewed elements of pertinent patient history.  Beatris Ship participated in the discussions, expressed understanding, and voiced agreement with the above plans.  All questions were answered to his satisfaction. he is encouraged to contact clinic should he have any questions or concerns prior to his return visit.     Follow up plan: - No follow-ups on file.   Ronny Bacon, [facility] [address] [place], [place] [address] Phone: [phone] Fax: [phone]  06/23/2023, 4:00 PM

## 2023-07-02 ENCOUNTER — Other Ambulatory Visit: Payer: Self-pay | Admitting: Nurse Practitioner

## 2023-07-03 ENCOUNTER — Other Ambulatory Visit: Payer: Self-pay | Admitting: Internal Medicine

## 2023-07-03 DIAGNOSIS — F3160 Bipolar disorder, current episode mixed, unspecified: Secondary | ICD-10-CM

## 2023-08-02 ENCOUNTER — Encounter: Payer: Self-pay | Admitting: Nurse Practitioner

## 2023-08-03 MED ORDER — TRULICITY 4.5 MG/0.5ML ~~LOC~~ SOAJ: 4.5000 mg | SUBCUTANEOUS | 3 refills | Status: DC

## 2023-08-10 ENCOUNTER — Other Ambulatory Visit: Payer: Self-pay | Admitting: Nurse Practitioner

## 2023-09-21 ENCOUNTER — Other Ambulatory Visit (HOSPITAL_COMMUNITY): Payer: Self-pay

## 2023-09-21 ENCOUNTER — Telehealth: Payer: Self-pay

## 2023-09-21 ENCOUNTER — Telehealth: Payer: Self-pay | Admitting: *Deleted

## 2023-09-21 NOTE — Telephone Encounter (Signed)
Pharmacy Patient Advocate Encounter   Received notification from Pt Calls Messages that prior authorization for Dexcom G7 sensor is required/requested.   Insurance verification completed.   The patient is insured through [facility] .   Per test claim: PA required; PA submitted to above mentioned insurance via CoverMyMeds Key/confirmation #/EOC [id number] Status is pending

## 2023-09-21 NOTE — Telephone Encounter (Signed)
Patient is asking about the status of his Dexcom CGM PA?

## 2023-09-26 LAB — HM DIABETES EYE EXAM

## 2023-09-26 NOTE — Telephone Encounter (Signed)
Pharmacy Patient Advocate Encounter  Received notification from [employer] that Prior Authorization for Dexcom G7 sensor has been DENIED.  Full denial letter will be uploaded to the media tab. See denial reason below.    PA #/Case ID/Reference #: [id number]

## 2023-09-27 NOTE — Telephone Encounter (Signed)
Noted, please tell the patient about Stelo (OTC CGM), which can be bought through [employer] and does not require Therapist, occupational.

## 2023-09-28 ENCOUNTER — Encounter: Payer: Self-pay | Admitting: Nurse Practitioner

## 2023-09-28 NOTE — Telephone Encounter (Signed)
Patient was called and a message was left with the denial and also Whitney's recommendation.

## 2023-10-06 ENCOUNTER — Encounter: Payer: Self-pay | Admitting: Nurse Practitioner

## 2023-10-06 ENCOUNTER — Ambulatory Visit: Payer: MEDICAID | Admitting: Nurse Practitioner

## 2023-10-06 VITALS — BP 112/77 | HR 84 | Ht 68.0 in | Wt 237.6 lb

## 2023-10-06 DIAGNOSIS — Z7984 Long term (current) use of oral hypoglycemic drugs: Secondary | ICD-10-CM | POA: Diagnosis not present

## 2023-10-06 DIAGNOSIS — E1165 Type 2 diabetes mellitus with hyperglycemia: Secondary | ICD-10-CM

## 2023-10-06 DIAGNOSIS — Z7985 Long-term (current) use of injectable non-insulin antidiabetic drugs: Secondary | ICD-10-CM | POA: Diagnosis not present

## 2023-10-06 DIAGNOSIS — Z794 Long term (current) use of insulin: Secondary | ICD-10-CM | POA: Diagnosis not present

## 2023-10-06 LAB — POCT GLYCOSYLATED HEMOGLOBIN (HGB A1C): Hemoglobin A1C: 6.7 % — AB (ref 4.0–5.6)

## 2023-10-06 MED ORDER — GLIPIZIDE ER 5 MG PO TB24: 5.0000 mg | ORAL_TABLET | Freq: Every day | ORAL | 3 refills | Status: DC

## 2023-10-06 MED ORDER — FREESTYLE LIBRE 3 PLUS SENSOR MISC: 3 refills | Status: DC

## 2023-10-06 MED ORDER — ACCU-CHEK GUIDE TEST VI STRP: ORAL_STRIP | 12 refills | Status: DC

## 2023-10-06 MED ORDER — TRULICITY 4.5 MG/0.5ML ~~LOC~~ SOAJ: 4.5000 mg | SUBCUTANEOUS | 3 refills | Status: DC

## 2023-10-06 MED ORDER — METFORMIN HCL ER 500 MG PO TB24: 1000.0000 mg | ORAL_TABLET | Freq: Every day | ORAL | 3 refills | Status: DC

## 2023-10-07 NOTE — Progress Notes (Signed)
Endocrinology Follow Up Note       10/07/2023, 7:02 AM   Subjective:    Patient ID: Isaac Weiss, male    DOB: [birth year].  Isaac Weiss is being seen in follow up after being seen in consultation for management of currently uncontrolled symptomatic diabetes requested by  Isaac Pali, FNP.   Past Medical History:  Diagnosis Date   Anxiety    Phreesia 07/27/2020   Arthritis    Phreesia 07/27/2020   Asthma    COPD (chronic obstructive pulmonary disease) (HCC)    Depression    Phreesia 07/27/2020   Diabetes mellitus without complication (HCC)    Hyperlipidemia    Phreesia 07/27/2020   Neuromuscular disorder (HCC)    Phreesia 07/27/2020   Sleep apnea    Phreesia 07/27/2020    History reviewed. No pertinent surgical history.  Social History   Socioeconomic History   Marital status: Married    Spouse name: Not on file   Number of children: Not on file   Years of education: Not on file   Highest education level: Not on file  Occupational History   Not on file  Tobacco Use   Smoking status: Every Day    Current packs/day: 1.00    Types: Cigarettes   Smokeless tobacco: Never  Vaping Use   Vaping status: Never Used  Substance and Sexual Activity   Alcohol use: Not Currently   Drug use: Never   Sexual activity: Not on file  Other Topics Concern   Not on file  Social History Narrative   Not on file   Social Determinants of Health   Financial Resource Strain: Not on file  Food Insecurity: Not on file  Transportation Needs: Not on file  Physical Activity: Not on file  Stress: Not on file  Social Connections: Unknown (03/04/2022)   [facility] from [facility]   Social Network    Social Network: Not on file    History reviewed. No pertinent family history.  Outpatient Encounter Medications as of 10/06/2023  Medication Sig   Accu-Chek FastClix Lancets MISC USE TO  CHECK BLOOD SUGAR FOUR TIMES DAILY BEFORE MEALS AND AT BEDTIME   Continuous Glucose Sensor (FREESTYLE LIBRE 3 PLUS SENSOR) MISC Change sensor every 15 days.   FLUoxetine (PROZAC) 40 MG capsule TAKE ONE CAPSULE BY MOUTH EVERY DAY   glucose blood (ACCU-CHEK GUIDE TEST) test strip Use as instructed to monitor glucose twice daily   Insulin Pen Needle (PEN NEEDLES 5/16") 30G X 8 MM MISC Use to inject insulin once daily.   Insulin Syringe 27G X 1/2" 1 ML MISC Inject 10 Units into the skin 3 (three) times daily before meals.   mupirocin ointment (BACTROBAN) 2 % Apply 1 Application topically 2 (two) times daily.   neomycin-polymyxin-hydrocortisone (CORTISPORIN) OTIC solution Place 3 drops into the left ear 4 (four) times daily. X 5-7 days   SURE COMFORT INS SYR 1CC/28G 28G X 1/2" 1 ML MISC USE WITH INSULIN THREE TIMES DAILY BEFORE MEALS   [DISCONTINUED] ACCU-CHEK GUIDE test strip USE TO TEST BLOOD SUGAR THREE TIMES DAILY   [DISCONTINUED] Dulaglutide (TRULICITY) 4.5 MG/0.5ML SOPN Inject 4.5 mg as directed once  a week.   [DISCONTINUED] glipiZIDE (GLUCOTROL XL) 5 MG 24 hr tablet Take 1 tablet (5 mg total) by mouth daily with breakfast.   [DISCONTINUED] metFORMIN (GLUCOPHAGE-XR) 500 MG 24 hr tablet Take 2 tablets (1,000 mg total) by mouth daily with breakfast.   albuterol (VENTOLIN HFA) 108 (90 Base) MCG/ACT inhaler INHALE TWO PUFFS BY MOUTH EVERY 6 HOURS AS NEEDED FOR wheezing, SHORTNESS OF BREATH (Patient not taking: Reported on 10/06/2023)   Continuous Glucose Receiver (DEXCOM G7 RECEIVER) DEVI Use to test BG. E11.65 (Patient not taking: Reported on 10/06/2023)   Dulaglutide (TRULICITY) 4.5 MG/0.5ML SOAJ Inject 4.5 mg as directed once a week.   FLOVENT HFA 110 MCG/ACT inhaler INHALE TWO PUFFS BY MOUTH TWICE DAILY (Patient not taking: Reported on 06/23/2023)   glipiZIDE (GLUCOTROL XL) 5 MG 24 hr tablet Take 1 tablet (5 mg total) by mouth daily with breakfast.   hydrOXYzine (ATARAX) 25 MG tablet Take 25 mg by  mouth 3 (three) times daily as needed. (Patient not taking: Reported on 04/22/2022)   lithium carbonate (ESKALITH) 450 MG CR tablet Take 900 mg by mouth daily. (Patient not taking: Reported on 04/22/2022)   lurasidone (LATUDA) 20 MG TABS tablet Take 20 mg by mouth daily. (Patient not taking: Reported on 04/22/2022)   metFORMIN (GLUCOPHAGE-XR) 500 MG 24 hr tablet Take 2 tablets (1,000 mg total) by mouth daily with breakfast.   [DISCONTINUED] Continuous Blood Gluc Sensor (DEXCOM G7 SENSOR) MISC Inject 1 Application into the skin as directed. Change sensor every 10 days as directed. (Patient not taking: Reported on 10/06/2023)   No facility-administered encounter medications on file as of 10/06/2023.    ALLERGIES: No Known Allergies  VACCINATION STATUS: Immunization History  Administered Date(s) Administered   Influenza,inj,Quad PF,6+ Mos 09/12/2019, 07/30/2020, 10/21/2021, 08/10/2022   PFIZER(Purple Top)SARS-COV-2 Vaccination 01/31/2020, 02/25/2020, 11/25/2020   Tdap 02/27/2022    Diabetes He presents for his follow-up diabetic visit. He has type 2 diabetes mellitus. Onset time: Diagnosed at approx age of 45. His disease course has been stable. There are no hypoglycemic associated symptoms. Associated symptoms include blurred vision and fatigue. Pertinent negatives for diabetes include no polydipsia, no polyuria and no weight loss. There are no hypoglycemic complications. Symptoms are improving. Diabetic complications include retinopathy. Risk factors for coronary artery disease include diabetes mellitus, family history, obesity, male sex, tobacco exposure and stress. Current diabetic treatment includes oral agent (dual therapy) (and Trulicity). He is compliant with treatment most of the time. His weight is fluctuating minimally. He is following a generally healthy diet. When asked about meal planning, he reported none. He has not had a previous visit with a dietitian. He participates in exercise  intermittently. His home blood glucose trend is fluctuating minimally. His overall blood glucose range is 140-180 mg/dl. (He presents today with his CGM data (last sensor expired about a week ago and insurance denied request for approval given lack of insulin) showing mostly at goal glycemic profile overall.  His POCT A1c today is 6.7%, increasing slightly from last visit of 6.4%.  He and his wife are asking if he needs sliding scale insulin as he does have spikes on occasion reaching 300 or more (no evidence of that high of a spike on his CGM data).  Analysis of his CGM shows TIR 83%, TAR 17%, TBR 0%.  He has had some significant hypoglycemia in the past, as low as in the 50s on occasion.) An ACE inhibitor/angiotensin II receptor blocker is not being taken. He does not  see a podiatrist.Eye exam is not current.     Review of systems  Constitutional: + stable body weight, current Body mass index is 36.13 kg/m., + fatigue, no subjective hyperthermia, no subjective hypothermia Eyes: + blurry vision (+ retinopathy), no xerophthalmia ENT: no sore throat, no nodules palpated in throat, no dysphagia/odynophagia, no hoarseness Cardiovascular: no chest pain, no shortness of breath, no palpitations, no leg swelling Respiratory: no cough, no shortness of breath Gastrointestinal: no nausea/vomiting/diarrhea Musculoskeletal: no muscle/joint aches Skin: no rashes, no hyperemia Neurological: no tremors, no numbness, no tingling, no dizziness Psychiatric: no depression, no anxiety  Objective:     BP 112/77 (BP Location: Left Arm, Patient Position: Sitting, Cuff Size: Large)   Pulse 84   Ht 5\' 8"  (1.727 m)   Wt 237 lb 9.6 oz (107.8 kg)   BMI 36.13 kg/m   Wt Readings from Last 3 Encounters:  10/06/23 237 lb 9.6 oz (107.8 kg)  06/23/23 231 lb 9.6 oz (105.1 kg)  03/22/23 229 lb (103.9 kg)     BP Readings from Last 3 Encounters:  10/06/23 112/77  06/23/23 119/72  03/22/23 102/72      Physical  Exam- Limited  Constitutional:  Body mass index is 36.13 kg/m. , not in acute distress, normal state of mind Eyes:  EOMI, no exophthalmos Musculoskeletal: no gross deformities, strength intact in all four extremities, no gross restriction of joint movements Skin:  no rashes, no hyperemia Neurological: no tremor with outstretched hands   Diabetic Foot Exam - Simple   No data filed    CMP ( most recent) CMP     Component Value Date/Time   NA 142 04/22/2022 1625   K 4.4 04/22/2022 1625   CL 102 04/22/2022 1625   CO2 26 04/22/2022 1625   GLUCOSE 213 (H) 04/22/2022 1625   GLUCOSE 514 (HH) 09/07/2019 2035   BUN 8 12/28/2022 0000   CREATININE 0.8 12/28/2022 0000   CREATININE 0.84 04/22/2022 1625   CALCIUM 9.0 04/22/2022 1625   PROT 6.7 04/22/2022 1625   ALBUMIN 4.3 04/22/2022 1625   AST 16 04/22/2022 1625   ALT 22 04/22/2022 1625   ALKPHOS 92 04/22/2022 1625   BILITOT 0.5 04/22/2022 1625   GFRNONAA 103 09/17/2020 1449   GFRAA 119 09/17/2020 1449     Diabetic Labs (most recent): Lab Results  Component Value Date   HGBA1C 6.7 (A) 10/06/2023   HGBA1C 6.4 (A) 06/23/2023   HGBA1C 7.0 (A) 03/22/2023     Lipid Panel ( most recent) Lipid Panel     Component Value Date/Time   CHOL 149 04/22/2022 1625   TRIG 142 12/28/2022 0000   HDL 31 (L) 04/22/2022 1625   CHOLHDL 4.8 04/22/2022 1625   LDLCALC 62 12/28/2022 0000   LDLCALC 83 04/22/2022 1625   LABVLDL 35 04/22/2022 1625      Lab Results  Component Value Date   TSH 1.39 12/28/2022   TSH 3.010 10/21/2021   TSH 4.580 (H) 06/17/2021   TSH 6.680 (H) 09/17/2020   FREET4 1.22 10/21/2021   FREET4 1.10 09/17/2020           Assessment & Plan:   1) Type 2 diabetes mellitus with hyperglycemia, with long-term current use of insulin (HCC)  He presents today with his CGM data (last sensor expired about a week ago and insurance denied request for approval given lack of insulin) showing mostly at goal glycemic profile  overall.  His POCT A1c today is 6.7%, increasing slightly from last  visit of 6.4%.  He and his wife are asking if he needs sliding scale insulin as he does have spikes on occasion reaching 300 or more (no evidence of that high of a spike on his CGM data).  Analysis of his CGM shows TIR 83%, TAR 17%, TBR 0%.  He has had some significant hypoglycemia in the past, as low as in the 50s on occasion.  - Isaac Weiss has currently uncontrolled symptomatic type 2 DM since 42 years of age.   -Recent labs reviewed.  - I had a long discussion with him about the progressive nature of diabetes and the pathology behind its complications. -his diabetes is complicated by retinopathy and he remains at a high risk for more acute and chronic complications which include CAD, CVA, CKD, retinopathy, and neuropathy. These are all discussed in detail with him.  The following Lifestyle Medicine recommendations according to American College of Lifestyle Medicine [facility]) were discussed and offered to patient and he agrees to start the journey:  A. Whole Foods, Plant-based plate comprising of fruits and vegetables, plant-based proteins, whole-grain carbohydrates was discussed in detail with the patient.   A list for source of those nutrients were also provided to the patient.  Patient will use only water or unsweetened tea for hydration. B.  The need to stay away from risky substances including alcohol, smoking; obtaining 7 to 9 hours of restorative sleep, at least 150 minutes of moderate intensity exercise weekly, the importance of healthy social connections,  and stress reduction techniques were discussed. C.  A full color page of  Calorie density of various food groups per pound showing examples of each food groups was provided to the patient.  - Nutritional counseling repeated at each appointment due to patients tendency to fall back in to old habits.  - The patient admits there is a room for improvement in their diet  and drink choices. -  Suggestion is made for the patient to avoid simple carbohydrates from their diet including Cakes, Sweet Desserts / Pastries, Ice Cream, Soda (diet and regular), Sweet Tea, Candies, Chips, Cookies, Sweet Pastries, Store Bought Juices, Alcohol in Excess of 1-2 drinks a day, Artificial Sweeteners, Coffee Creamer, and "Sugar-free" Products. This will help patient to have stable blood glucose profile and potentially avoid unintended weight gain.   - I encouraged the patient to switch to unprocessed or minimally processed complex starch and increased protein intake (animal or plant source), fruits, and vegetables.   - Patient is advised to stick to a routine mealtimes to eat 3 meals a day and avoid unnecessary snacks (to snack only to correct hypoglycemia).  - I have approached him with the following individualized plan to manage his diabetes and patient agrees:   -He is advised to continue Trulicity 4.5 mg SQ weekly, Metformin 1000 mg ER daily with breakfast, and Glipizide 5 mg XL daily with breakfast.  -he is encouraged to continue monitoring glucose at least twice daily, before breakfast and before bed, and to call the clinic if he has readings less than 70 or above 300 for 3 tests in a row.  I did try sending in the [place] 3 for him to his pharmacy, perhaps given he is on SU and has had hypoglycemia in the past we can get it approved for him.  I did give him the option to use Stelo but it is still too expensive for him right now.  - he is warned not to take insulin without proper monitoring  per orders. - Adjustment parameters are given to him for hypo and hyperglycemia in writing.  - Specific targets for  A1c; LDL, HDL, and Triglycerides were discussed with the patient.  2) Blood Pressure /Hypertension:  his blood pressure is controlled to target without the use of antihypertensive medications.   3) Lipids/Hyperlipidemia:    Review of his recent lipid panel from 12/28/22  showed controlled LDL at 62, he is not currently on any lipid lowering medications.  Will recheck lipid panel prior to next visit.  4)  Weight/Diet:  his Body mass index is 36.13 kg/m.  -  clearly complicating his diabetes care.   he is a candidate for weight loss. I discussed with him the fact that loss of 5 - 10% of his  current body weight will have the most impact on his diabetes management.  Exercise, and detailed carbohydrates information provided  -  detailed on discharge instructions.  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  5) Chronic Care/Health Maintenance: -he is not on ACEI/ARB or Statin medications and is encouraged to initiate and continue to follow up with Ophthalmology, Dentist, Podiatrist at least yearly or according to recommendations, and advised to [facility]. I have recommended yearly flu vaccine and pneumonia vaccine at least every 5 years; moderate intensity exercise for up to 150 minutes weekly; and sleep for at least 7 hours a day.  - he is advised to maintain close follow up with Isaac Pali, FNP for primary care needs, as well as his other providers for optimal and coordinated care.     I spent  35  minutes in the care of the patient today including review of labs from CMP, Lipids, Thyroid Function, Hematology (current and previous including abstractions from other facilities); face-to-face time discussing  his blood glucose readings/logs, discussing hypoglycemia and hyperglycemia episodes and symptoms, medications doses, his options of short and long term treatment based on the latest standards of care / guidelines;  discussion about incorporating lifestyle medicine;  and documenting the encounter. Risk reduction counseling performed per USPSTF guidelines to reduce obesity and cardiovascular risk factors.     Please refer to Patient Instructions for Blood Glucose Monitoring and Insulin/Medications Dosing Guide"  in media tab for additional information. Please   also refer to " Patient Self Inventory" in the Media  tab for reviewed elements of pertinent patient history.  Beatris Ship participated in the discussions, expressed understanding, and voiced agreement with the above plans.  All questions were answered to his satisfaction. he is encouraged to contact clinic should he have any questions or concerns prior to his return visit.     Follow up plan: - Return in about 3 months (around 01/04/2024) for Diabetes F/U with A1c in office, Previsit labs, Bring meter and logs.   Ronny Bacon, [facility] [address] [place], [place] [address] Phone: [phone] Fax: [phone]  10/07/2023, 7:02 AM

## 2023-10-22 ENCOUNTER — Other Ambulatory Visit: Payer: Self-pay | Admitting: Internal Medicine

## 2023-10-22 DIAGNOSIS — E119 Type 2 diabetes mellitus without complications: Secondary | ICD-10-CM

## 2023-11-10 ENCOUNTER — Other Ambulatory Visit: Payer: Self-pay | Admitting: Internal Medicine

## 2023-11-10 DIAGNOSIS — E119 Type 2 diabetes mellitus without complications: Secondary | ICD-10-CM

## 2023-12-03 ENCOUNTER — Encounter: Payer: Self-pay | Admitting: Nurse Practitioner

## 2023-12-06 ENCOUNTER — Other Ambulatory Visit: Payer: Self-pay | Admitting: Nurse Practitioner

## 2023-12-06 MED ORDER — TRULICITY 4.5 MG/0.5ML ~~LOC~~ SOAJ: 4.5000 mg | SUBCUTANEOUS | 3 refills | Status: DC

## 2023-12-12 ENCOUNTER — Telehealth: Payer: Self-pay

## 2023-12-12 ENCOUNTER — Ambulatory Visit (INDEPENDENT_AMBULATORY_CARE_PROVIDER_SITE_OTHER): Payer: Medicaid Other | Admitting: Urology

## 2023-12-12 VITALS — BP 108/75 | HR 79

## 2023-12-12 DIAGNOSIS — Z3009 Encounter for other general counseling and advice on contraception: Secondary | ICD-10-CM | POA: Diagnosis not present

## 2023-12-12 NOTE — Patient Instructions (Signed)
 Vasectomy Postoperative Instructions  Please bring back a semen analysis in approximately 3 months.  These are scheduled by appointment only.  PLEASE NOTE THE LOCATION BELOW  Your semen analysis  will need to be taken to:  [address] [place], [place] [address] Phone Number: [phone] Hours of scheduling: Monday, Tuesday and Wednesday 10 a.m.- 12 p.m. or 1 p.m.- 3 p.m. [facility] will provide collection instructions at the time of scheduling your appointment   You will be given a sterile specimen cup. Please label the cup with your name, date of birth, date and time of collections.  What to Expect  - slight redness, swelling and scant drainage along the incision  - mild to moderate discomfort  - black and blue (bruising) as the tissue heals  - low grade fever  - scrotal sensitivity and/or tenderness - Edges of the incision may pull apart and heal slowly, sometimes a knot may be present which remains for several months.  This is NORMAL and all part of the healing process. - if stitches are placed, they do not need to be removed - if you have pain or discomfort immediately after the vasectomy, you may use OTC pain medication for relief , ex: tylenol.  After local anesthetic wears off an ice pack will provide additional comfort and can also prevent swelling if used  Activity  - no sexual intercourse for at lease 5 days depending on comfort  - no heavy lifting for 48-72 hours (anything over 5-10 lbs)  Wound Care  - shower only after 24 hours  - no tub baths, hot tub, or pools for at least 7 days  - ice packs for 48 hours: 30 minutes on and 30 minutes off  Problem to Report  - generalized redness  - increased pain and swelling  - fever greater than 101 F  - significant drainage or bleeding from the wound  TO DO - Ejaculations help to clear the passage of sperm, but you must use another from of birth control until you are told you may discontinue its use!! - You will be given a  specimen cup to bring back a semen sample in 3 months to check and see if its clear of sperm.  Only after the semen is sent for analysis and is reported back as clear should you use this as your primary form of birth control!

## 2023-12-12 NOTE — Progress Notes (Signed)
NA

## 2023-12-12 NOTE — Telephone Encounter (Signed)
 Pt wife called to ask what time to take his Rx prior to today's appointment and what else needed to be done we let her know to tell him to take Rx and 3:30pm and shave before hand is possible but fine either way

## 2023-12-13 ENCOUNTER — Encounter: Payer: Self-pay | Admitting: Urology

## 2023-12-13 NOTE — Progress Notes (Signed)
12/12/23  CC: desires sterilization   HPI: Mr Drab is a 42yo here for vasectomy Blood pressure 108/75, pulse 79. NED. A&Ox3.   No respiratory distress   Abd soft, NT, ND Normal external genitalia with patent urethral meatus  A timeout was performed.  Patient's identity and consent was confirmed.  All questions were answered.   Bilateral Vasectomy Procedure  Pre-Procedure: - Patient's scrotum was prepped and draped for vasectomy. - The vas was palpated through the scrotal skin on the left. - 1% Xylocaine was injected into the skin and surrounding tissue for placement  - In a similar manner, the vas on the right was identified, anesthetized, and stabilized.  Procedure: - A sharp hemostat was used to make a small stab incision in the skin overlying the vas - The left vas was isolated and brought up through the incision exposing that structure. - Bleeding points were cauterized as they occurred. - The vas was free from the surrounding structures and brought to the view. - A segment was positioned for placement with a hemostat. - A second hemostat was placed and a small segment between the two hemostats and was removed for inspection. - Each end of the transected vas lumen was fulgurated/ obliterated using needlepoint electrocautery -A fascial interposition was performed on testicular end of the vas using #3-0 chromic suture -The same procedure was performed on the right. - A single suture of #3-0 chromic catgut was used to close each lateral scrotal skin incision - A dressing was applied.  Post-Procedure: - Patient was instructed in care of the operative area - A specimen is to be delivered in 12 weeks   -Another form of contraception is to be used until post vasectomy semen analysis  Wilkie Aye, MD

## 2023-12-26 ENCOUNTER — Other Ambulatory Visit (HOSPITAL_COMMUNITY): Payer: Self-pay

## 2023-12-26 MED ORDER — PEN NEEDLES 31G X 8 MM MISC: 3 refills | Status: DC

## 2023-12-26 MED ORDER — DEXCOM G7 SENSOR MISC: 1.0000 | 3 refills | Status: DC

## 2023-12-26 MED ORDER — LANTUS SOLOSTAR 100 UNIT/ML ~~LOC~~ SOPN: 20.0000 [IU] | PEN_INJECTOR | Freq: Every day | SUBCUTANEOUS | 3 refills | Status: DC

## 2023-12-26 NOTE — Telephone Encounter (Signed)
 Patient states pharmacy is waiting on PA for Trulicity to be filled.

## 2023-12-27 ENCOUNTER — Telehealth: Payer: Self-pay

## 2023-12-27 ENCOUNTER — Other Ambulatory Visit (HOSPITAL_COMMUNITY): Payer: Self-pay

## 2023-12-27 NOTE — Telephone Encounter (Signed)
 Pharmacy Patient Advocate Encounter   Received notification from Patient Advice Request messages that prior authorization for Trulicity is required/requested.   Insurance verification completed.   The patient is insured through [facility] .   Per test claim: PA required; PA submitted to above mentioned insurance via CoverMyMeds Key/confirmation #/EOC [facility] Status is pending

## 2023-12-28 ENCOUNTER — Telehealth: Payer: Self-pay

## 2023-12-28 NOTE — Telephone Encounter (Signed)
 Pharmacy Patient Advocate Encounter  Received notification from [facility] that Prior Authorization for Dexcom G7 sensor has been APPROVED through 06/25/24   PA #/Case ID/Reference #: [id number]

## 2023-12-28 NOTE — Telephone Encounter (Signed)
 Patient was called and made aware.

## 2023-12-28 NOTE — Telephone Encounter (Signed)
 Pharmacy Patient Advocate Encounter  Received notification from [facility] that Prior Authorization for Trulicity has been APPROVED through 12/26/24   PA #/Case ID/Reference #:  [id number]

## 2023-12-28 NOTE — Telephone Encounter (Signed)
 Pharmacy Patient Advocate Encounter   Received notification from CoverMyMeds that prior authorization for Dexcom G7 sensor is required/requested.   Insurance verification completed.   The patient is insured through [facility] .   Per test claim: PA required; PA submitted to above mentioned insurance via CoverMyMeds Key/confirmation #/EOC [id number] Status is pending

## 2024-01-11 ENCOUNTER — Ambulatory Visit: Payer: Medicaid Other | Admitting: Nurse Practitioner

## 2024-01-11 ENCOUNTER — Telehealth: Payer: Self-pay | Admitting: Nurse Practitioner

## 2024-01-11 DIAGNOSIS — E1165 Type 2 diabetes mellitus with hyperglycemia: Secondary | ICD-10-CM

## 2024-01-11 DIAGNOSIS — Z7985 Long-term (current) use of injectable non-insulin antidiabetic drugs: Secondary | ICD-10-CM

## 2024-01-11 DIAGNOSIS — E11319 Type 2 diabetes mellitus with unspecified diabetic retinopathy without macular edema: Secondary | ICD-10-CM

## 2024-01-11 DIAGNOSIS — Z794 Long term (current) use of insulin: Secondary | ICD-10-CM

## 2024-01-11 DIAGNOSIS — Z7984 Long term (current) use of oral hypoglycemic drugs: Secondary | ICD-10-CM

## 2024-01-11 NOTE — Telephone Encounter (Signed)
 No, its ok

## 2024-01-11 NOTE — Telephone Encounter (Signed)
 Pt moved her appt to May,do you want repeat labs

## 2024-03-13 LAB — LIPID PANEL
Chol/HDL Ratio: 4.6 ratio (ref 0.0–5.0)
Cholesterol, Total: 153 mg/dL (ref 100–199)
HDL: 33 mg/dL — ABNORMAL LOW (ref >39)
LDL Chol Calc (NIH): 93 mg/dL (ref 0–99)
Triglycerides: 152 mg/dL — ABNORMAL HIGH (ref 0–149)
VLDL Cholesterol Cal: 27 mg/dL (ref 5–40)

## 2024-03-13 LAB — COMPREHENSIVE METABOLIC PANEL WITH GFR
ALT: 24 IU/L (ref 0–44)
AST: 21 IU/L (ref 0–40)
Albumin: 4.1 g/dL (ref 4.1–5.1)
Alkaline Phosphatase: 92 IU/L (ref 44–121)
BUN/Creatinine Ratio: 12 (ref 9–20)
BUN: 10 mg/dL (ref 6–24)
Bilirubin Total: 0.6 mg/dL (ref 0.0–1.2)
CO2: 23 mmol/L (ref 20–29)
Calcium: 8.6 mg/dL — ABNORMAL LOW (ref 8.7–10.2)
Chloride: 105 mmol/L (ref 96–106)
Creatinine, Ser: 0.83 mg/dL (ref 0.76–1.27)
Globulin, Total: 2.3 g/dL (ref 1.5–4.5)
Glucose: 144 mg/dL — ABNORMAL HIGH (ref 70–99)
Potassium: 4.5 mmol/L (ref 3.5–5.2)
Sodium: 141 mmol/L (ref 134–144)
Total Protein: 6.4 g/dL (ref 6.0–8.5)
eGFR: 112 mL/min/{1.73_m2} (ref >59)

## 2024-03-13 LAB — TSH: TSH: 1.73 u[IU]/mL (ref 0.450–4.500)

## 2024-03-13 LAB — T4, FREE: Free T4: 1.07 ng/dL (ref 0.82–1.77)

## 2024-03-16 ENCOUNTER — Encounter: Payer: Self-pay | Admitting: Nurse Practitioner

## 2024-03-16 ENCOUNTER — Ambulatory Visit: Admitting: Nurse Practitioner

## 2024-03-16 VITALS — BP 110/80 | HR 67 | Ht 68.0 in | Wt 251.2 lb

## 2024-03-16 DIAGNOSIS — E1165 Type 2 diabetes mellitus with hyperglycemia: Secondary | ICD-10-CM

## 2024-03-16 DIAGNOSIS — E11319 Type 2 diabetes mellitus with unspecified diabetic retinopathy without macular edema: Secondary | ICD-10-CM

## 2024-03-16 DIAGNOSIS — Z794 Long term (current) use of insulin: Secondary | ICD-10-CM | POA: Diagnosis not present

## 2024-03-16 DIAGNOSIS — Z7985 Long-term (current) use of injectable non-insulin antidiabetic drugs: Secondary | ICD-10-CM | POA: Diagnosis not present

## 2024-03-16 DIAGNOSIS — Z7984 Long term (current) use of oral hypoglycemic drugs: Secondary | ICD-10-CM

## 2024-03-16 LAB — POCT GLYCOSYLATED HEMOGLOBIN (HGB A1C): Hemoglobin A1C: 7.1 % — AB (ref 4.0–5.6)

## 2024-03-16 NOTE — Progress Notes (Signed)
 Endocrinology Follow Up Note       03/16/2024, 11:49 AM   Subjective:    Patient ID: Isaac Weiss, male    DOB: 1981/06/20.  Isaac Weiss is being seen in follow up after being seen in consultation for management of currently uncontrolled symptomatic diabetes requested by  Joanne Muckle, FNP.   Past Medical History:  Diagnosis Date   Anxiety    Phreesia 07/27/2020   Arthritis    [employer]reesia 07/27/2020   Asthma    COPD (chronic obstructive pulmonary disease) (HCC)    Depression    [employer]reesia 07/27/2020   Diabetes mellitus without complication (HCC)    Hyperlipidemia    Phreesia 07/27/2020   Neuromuscular disorder (HCC)    Phreesia 07/27/2020   Sleep apnea    Phreesia 07/27/2020    History reviewed. No pertinent surgical history.  Social History   Socioeconomic History   Marital status: Married    Spouse name: Not on file   Number of children: Not on file   Years of education: Not on file   Highest education level: Not on file  Occupational History   Not on file  Tobacco Use   Smoking status: Every Day    Current packs/day: 1.00    Types: Cigarettes   Smokeless tobacco: Never  Vaping Use   Vaping status: Never Used  Substance and Sexual Activity   Alcohol use: Not Currently   Drug use: Never   Sexual activity: Not on file  Other Topics Concern   Not on file  Social History Narrative   Not on file   Social Drivers of Health   Financial Resource Strain: Not on file  Food Insecurity: Not on file  Transportation Needs: Not on file  Physical Activity: Not on file  Stress: Not on file  Social Connections: Unknown (03/04/2022)   [facility]   Social Network    Social Network: Not on file    History reviewed. No pertinent family history.  Outpatient Encounter Medications as of 03/16/2024  Medication Sig   Accu-Chek FastClix Lancets MISC USE TO CHECK  BLOOD SUGAR FOUR TIMES DAILY BEFORE MEALS AND AT BEDTIME   Continuous Glucose Receiver (DEXCOM G7 RECEIVER) DEVI Use to test BG. E11.65   Continuous Glucose Sensor (DEXCOM G7 SENSOR) MISC Inject 1 Application into the skin as directed. Change sensor every 10 days as directed.   Dulaglutide  (TRULICITY ) 4.5 MG/0.5ML SOAJ Inject 4.5 mg as directed once a week.   FLUoxetine  (PROZAC ) 40 MG capsule TAKE ONE CAPSULE BY MOUTH EVERY DAY   glipiZIDE  (GLUCOTROL  XL) 5 MG 24 hr tablet Take 1 tablet (5 mg total) by mouth daily with breakfast.   glucose blood (ACCU-CHEK GUIDE TEST) test strip Use as instructed to monitor glucose twice daily   insulin  glargine (LANTUS  SOLOSTAR) 100 UNIT/ML Solostar Pen Inject 20 Units into the skin at bedtime.   Insulin  Pen Needle (PEN NEEDLES) 31G X 8 MM MISC Use to inject insulin  once daily   Insulin  Syringe 27G X 1/2" 1 ML MISC Inject 10 Units into the skin 3 (three) times daily before meals.   metFORMIN  (GLUCOPHAGE -XR) 500 MG 24 hr tablet Take  2 tablets (1,000 mg total) by mouth daily with breakfast.   SURE COMFORT INS SYR 1CC/28G 28G X 1/2" 1 ML MISC USE WITH INSULIN  THREE TIMES DAILY BEFORE MEALS   albuterol  (VENTOLIN  HFA) 108 (90 Base) MCG/ACT inhaler INHALE TWO PUFFS BY MOUTH EVERY 6 HOURS AS NEEDED FOR wheezing, SHORTNESS OF BREATH (Patient not taking: Reported on 03/16/2024)   FLOVENT  HFA 110 MCG/ACT inhaler INHALE TWO PUFFS BY MOUTH TWICE DAILY (Patient not taking: Reported on 03/16/2024)   hydrOXYzine (ATARAX) 25 MG tablet Take 25 mg by mouth 3 (three) times daily as needed. (Patient not taking: Reported on 03/16/2024)   lithium carbonate (ESKALITH) 450 MG CR tablet Take 900 mg by mouth daily. (Patient not taking: Reported on 03/16/2024)   lurasidone (LATUDA) 20 MG TABS tablet Take 20 mg by mouth daily. (Patient not taking: Reported on 04/22/2022)   mupirocin  ointment (BACTROBAN ) 2 % Apply 1 Application topically 2 (two) times daily. (Patient not taking: Reported on  03/16/2024)   neomycin -polymyxin-hydrocortisone (CORTISPORIN) OTIC solution Place 3 drops into the left ear 4 (four) times daily. X 5-7 days (Patient not taking: Reported on 03/16/2024)   No facility-administered encounter medications on file as of 03/16/2024.    ALLERGIES: No Known Allergies  VACCINATION STATUS: Immunization History  Administered Date(s) Administered   Influenza,inj,Quad PF,6+ Mos 09/12/2019, 07/30/2020, 10/21/2021, 08/10/2022   PFIZER(Purple Top)SARS-COV-2 Vaccination 01/31/2020, 02/25/2020, 11/25/2020   Tdap 02/27/2022    Diabetes He presents for his follow-up diabetic visit. He has type 2 diabetes mellitus. Onset time: Diagnosed at approx age of 16. His disease course has been fluctuating. There are no hypoglycemic associated symptoms. Associated symptoms include blurred vision and fatigue. Pertinent negatives for diabetes include no polydipsia, no polyuria and no weight loss. There are no hypoglycemic complications. Symptoms are improving. Diabetic complications include retinopathy. Risk factors for coronary artery disease include diabetes mellitus, family history, obesity, male sex, tobacco exposure and stress. Current diabetic treatment includes oral agent (dual therapy) and insulin  injections (and Trulicity ). He is compliant with treatment most of the time. His weight is fluctuating minimally. He is following a generally healthy diet. When asked about meal planning, he reported none. He has not had a previous visit with a dietitian. He participates in exercise intermittently. His home blood glucose trend is increasing steadily. His overall blood glucose range is 180-200 mg/dl. (He presents today with his CGM showing slightly above target glycemic profile.  His POCT A1c today is 7.1%, increasing from last visit of 6.7%.  Analysis of his CGM shows TIR 41%, TAR 59%, TBR 0% with a GMI of 8%.  He and his wife welcomed their new baby between visits.  He has been late night snacking  more recently.) An ACE inhibitor/angiotensin II receptor blocker is not being taken. He does not see a podiatrist.Eye exam is not current.     Review of systems  Constitutional: + increasing body weight, current Body mass index is 38.19 kg/m., + fatigue, no subjective hyperthermia, no subjective hypothermia Eyes: + blurry vision (+ retinopathy), no xerophthalmia ENT: no sore throat, no nodules palpated in throat, no dysphagia/odynophagia, no hoarseness Cardiovascular: no chest pain, no shortness of breath, no palpitations, no leg swelling Respiratory: no cough, no shortness of breath Gastrointestinal: no nausea/vomiting/diarrhea Musculoskeletal: no muscle/joint aches Skin: no rashes, no hyperemia Neurological: no tremors, no numbness, no tingling, no dizziness Psychiatric: no depression, no anxiety  Objective:     BP 110/80 (BP Location: Left Arm, Patient Position: Sitting, Cuff Size: Large)  Pulse 67   Ht 5\' 8"  (1.727 m)   Wt 251 lb 3.2 oz (113.9 kg)   BMI 38.19 kg/m   Wt Readings from Last 3 Encounters:  03/16/24 251 lb 3.2 oz (113.9 kg)  10/06/23 237 lb 9.6 oz (107.8 kg)  06/23/23 231 lb 9.6 oz (105.1 kg)     BP Readings from Last 3 Encounters:  03/16/24 110/80  12/12/23 108/75  10/06/23 112/77      Physical Exam- Limited  Constitutional:  Body mass index is 38.19 kg/m. , not in acute distress, normal state of mind Eyes:  EOMI, no exophthalmos Musculoskeletal: no gross deformities, strength intact in all four extremities, no gross restriction of joint movements Skin:  no rashes, no hyperemia Neurological: no tremor with outstretched hands   Diabetic Foot Exam - Simple   No data filed    CMP ( most recent) CMP     Component Value Date/Time   NA 141 03/12/2024 1000   K 4.5 03/12/2024 1000   CL 105 03/12/2024 1000   CO2 23 03/12/2024 1000   GLUCOSE 144 (H) 03/12/2024 1000   GLUCOSE 514 (HH) 09/07/2019 2035   BUN 10 03/12/2024 1000   CREATININE 0.83  03/12/2024 1000   CALCIUM 8.6 (L) 03/12/2024 1000   PROT 6.4 03/12/2024 1000   ALBUMIN 4.1 03/12/2024 1000   AST 21 03/12/2024 1000   ALT 24 03/12/2024 1000   ALKPHOS 92 03/12/2024 1000   BILITOT 0.6 03/12/2024 1000   GFRNONAA 103 09/17/2020 1449   GFRAA 119 09/17/2020 1449     Diabetic Labs (most recent): Lab Results  Component Value Date   HGBA1C 7.1 (A) 03/16/2024   HGBA1C 6.7 (A) 10/06/2023   HGBA1C 6.4 (A) 06/23/2023     Lipid Panel ( most recent) Lipid Panel     Component Value Date/Time   CHOL 153 03/12/2024 1000   TRIG 152 (H) 03/12/2024 1000   HDL 33 (L) 03/12/2024 1000   CHOLHDL 4.6 03/12/2024 1000   LDLCALC 93 03/12/2024 1000   LABVLDL 27 03/12/2024 1000      Lab Results  Component Value Date   TSH 1.730 03/12/2024   TSH 1.39 12/28/2022   TSH 3.010 10/21/2021   TSH 4.580 (H) 06/17/2021   TSH 6.680 (H) 09/17/2020   FREET4 1.07 03/12/2024   FREET4 1.22 10/21/2021   FREET4 1.10 09/17/2020           Assessment & Plan:   1) Type 2 diabetes mellitus with hyperglycemia, with long-term current use of insulin  (HCC)  He presents today with his CGM showing slightly above target glycemic profile.  His POCT A1c today is 7.1%, increasing from last visit of 6.7%.  Analysis of his CGM shows TIR 41%, TAR 59%, TBR 0% with a GMI of 8%.  He and his wife welcomed their new baby between visits.  He has been late night snacking more recently.  - Iliyan Mould has currently uncontrolled symptomatic type 2 DM since 43 years of age.   -Recent labs reviewed.  - I had a long discussion with him about the progressive nature of diabetes and the pathology behind its complications. -his diabetes is complicated by retinopathy and he remains at a high risk for more acute and chronic complications which include CAD, CVA, CKD, retinopathy, and neuropathy. These are all discussed in detail with him.  The following Lifestyle Medicine recommendations according to American  College of Lifestyle Medicine [facility]) were discussed and offered to patient and he agrees  to start the journey:  A. Whole Foods, Plant-based plate comprising of fruits and vegetables, plant-based proteins, whole-grain carbohydrates was discussed in detail with the patient.   A list for source of those nutrients were also provided to the patient.  Patient will use only water or unsweetened tea for hydration. B.  The need to stay away from risky substances including alcohol, smoking; obtaining 7 to 9 hours of restorative sleep, at least 150 minutes of moderate intensity exercise weekly, the importance of healthy social connections,  and stress reduction techniques were discussed. C.  A full color page of  Calorie density of various food groups per pound showing examples of each food groups was provided to the patient.  - Nutritional counseling repeated at each appointment due to patients tendency to fall back in to old habits.  - The patient admits there is a room for improvement in their diet and drink choices. -  Suggestion is made for the patient to avoid simple carbohydrates from their diet including Cakes, Sweet Desserts / Pastries, Ice Cream, Soda (diet and regular), Sweet Tea, Candies, Chips, Cookies, Sweet Pastries, Store Bought Juices, Alcohol in Excess of 1-2 drinks a day, Artificial Sweeteners, Coffee Creamer, and "Sugar-free" Products. This will help patient to have stable blood glucose profile and potentially avoid unintended weight gain.   - I encouraged the patient to switch to unprocessed or minimally processed complex starch and increased protein intake (animal or plant source), fruits, and vegetables.   - Patient is advised to stick to a routine mealtimes to eat 3 meals a day and avoid unnecessary snacks (to snack only to correct hypoglycemia).  - I have approached him with the following individualized plan to manage his diabetes and patient agrees:   -He is advised to continue Lantus   20 units SQ nightly, Trulicity  4.5 mg SQ weekly, Metformin  1000 mg ER daily with breakfast, and Glipizide  5 mg XL daily with breakfast.  -he is encouraged to continue monitoring glucose at least twice daily (using his CGM), before breakfast and before bed, and to call the clinic if he has readings less than 70 or above 300 for 3 tests in a row.    - he is warned not to take insulin  without proper monitoring per orders. - Adjustment parameters are given to him for hypo and hyperglycemia in writing.  - Specific targets for  A1c; LDL, HDL, and Triglycerides were discussed with the patient.  2) Blood Pressure /Hypertension:  his blood pressure is controlled to target without the use of antihypertensive medications.   3) Lipids/Hyperlipidemia:    Review of his recent lipid panel from 03/12/24 showed controlled LDL at 93 and slightly elevated triglycerides of 152, he is not currently on any lipid lowering medications.    4)  Weight/Diet:  his Body mass index is 38.19 kg/m.  -  clearly complicating his diabetes care.   he is a candidate for weight loss. I discussed with him the fact that loss of 5 - 10% of his  current body weight will have the most impact on his diabetes management.  Exercise, and detailed carbohydrates information provided  -  detailed on discharge instructions.  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  5) Chronic Care/Health Maintenance: -he is not on ACEI/ARB or Statin medications and is encouraged to initiate and continue to follow up with Ophthalmology, Dentist, Podiatrist at least yearly or according to recommendations, and advised to [facility]. I have recommended yearly flu vaccine and pneumonia vaccine at  least every 5 years; moderate intensity exercise for up to 150 minutes weekly; and sleep for at least 7 hours a day.  - he is advised to maintain close follow up with Joanne Muckle, FNP for primary care needs, as well as his other providers for optimal and  coordinated care.     I spent  55  minutes in the care of the patient today including review of labs from CMP, Lipids, Thyroid Function, Hematology (current and previous including abstractions from other facilities); face-to-face time discussing  his blood glucose readings/logs, discussing hypoglycemia and hyperglycemia episodes and symptoms, medications doses, his options of short and long term treatment based on the latest standards of care / guidelines;  discussion about incorporating lifestyle medicine;  and documenting the encounter. Risk reduction counseling performed per USPSTF guidelines to reduce obesity and cardiovascular risk factors.     Please refer to Patient Instructions for Blood Glucose Monitoring and Insulin /Medications Dosing Guide"  in media tab for additional information. Please  also refer to " Patient Self Inventory" in the Media  tab for reviewed elements of pertinent patient history.  Heriberto London participated in the discussions, expressed understanding, and voiced agreement with the above plans.  All questions were answered to his satisfaction. he is encouraged to contact clinic should he have any questions or concerns prior to his return visit.     Follow up plan: - Return in about 4 months (around 07/17/2024) for Diabetes F/U with A1c in office, No previsit labs, Bring meter and logs.   Hulon Magic, [facility] [address] [place], [place] [address] Phone: [phone] Fax: [phone]  03/16/2024, 11:49 AM

## 2024-05-08 ENCOUNTER — Other Ambulatory Visit: Payer: Self-pay | Admitting: Nurse Practitioner

## 2024-05-11 ENCOUNTER — Telehealth: Payer: Self-pay

## 2024-05-11 ENCOUNTER — Other Ambulatory Visit (HOSPITAL_COMMUNITY): Payer: Self-pay

## 2024-05-11 NOTE — Telephone Encounter (Signed)
 Pharmacy Patient Advocate Encounter   Received notification from CoverMyMeds that prior authorization for Dexcom G7 receiver is required/requested.   Insurance verification completed.   The patient is insured through [facility] .   Per test claim: Refill too soon. PA is not needed at this time. Medication was filled 04/04/24. Next eligible fill date is 06/27/24.

## 2024-07-17 ENCOUNTER — Ambulatory Visit: Admitting: Nurse Practitioner

## 2024-07-17 ENCOUNTER — Encounter: Payer: Self-pay | Admitting: Nurse Practitioner

## 2024-07-17 VITALS — BP 108/70 | HR 70 | Ht 68.0 in | Wt 245.8 lb

## 2024-07-17 DIAGNOSIS — Z7985 Long-term (current) use of injectable non-insulin antidiabetic drugs: Secondary | ICD-10-CM | POA: Diagnosis not present

## 2024-07-17 DIAGNOSIS — Z7984 Long term (current) use of oral hypoglycemic drugs: Secondary | ICD-10-CM

## 2024-07-17 DIAGNOSIS — E1165 Type 2 diabetes mellitus with hyperglycemia: Secondary | ICD-10-CM

## 2024-07-17 DIAGNOSIS — Z794 Long term (current) use of insulin: Secondary | ICD-10-CM

## 2024-07-17 LAB — POCT GLYCOSYLATED HEMOGLOBIN (HGB A1C): Hemoglobin A1C: 6.9 % — AB (ref 4.0–5.6)

## 2024-07-17 LAB — POCT UA - MICROALBUMIN

## 2024-07-17 MED ORDER — METFORMIN HCL ER 500 MG PO TB24: 1000.0000 mg | ORAL_TABLET | Freq: Every day | ORAL | 3 refills | Status: AC

## 2024-07-17 MED ORDER — DEXCOM G7 SENSOR MISC: 1.0000 | 3 refills | Status: AC

## 2024-07-17 MED ORDER — LANTUS SOLOSTAR 100 UNIT/ML ~~LOC~~ SOPN: 30.0000 [IU] | PEN_INJECTOR | Freq: Every day | SUBCUTANEOUS | 3 refills | Status: AC

## 2024-07-17 MED ORDER — PEN NEEDLES 31G X 8 MM MISC: 3 refills | Status: AC

## 2024-07-17 MED ORDER — TRULICITY 4.5 MG/0.5ML ~~LOC~~ SOAJ: 4.5000 mg | SUBCUTANEOUS | 3 refills | Status: DC

## 2024-07-17 MED ORDER — GLIPIZIDE ER 5 MG PO TB24: 5.0000 mg | ORAL_TABLET | Freq: Every day | ORAL | 3 refills | Status: AC

## 2024-07-17 NOTE — Progress Notes (Signed)
 Endocrinology Follow Up Note       07/17/2024, 3:43 PM   Subjective:    Patient ID: Isaac Weiss, male    DOB: [birth year].  Adil Tugwell is being seen in follow up after being seen in consultation for management of currently uncontrolled symptomatic diabetes requested by  Gerome Tillman CROME, FNP.   Past Medical History:  Diagnosis Date   Anxiety    Phreesia 07/27/2020   Arthritis    Phreesia 07/27/2020   Asthma    COPD (chronic obstructive pulmonary disease) (HCC)    Depression    Phreesia 07/27/2020   Diabetes mellitus without complication (HCC)    Hyperlipidemia    Phreesia 07/27/2020   Neuromuscular disorder (HCC)    Phreesia 07/27/2020   Sleep apnea    Phreesia 07/27/2020    History reviewed. No pertinent surgical history.  Social History   Socioeconomic History   Marital status: Married    Spouse name: Not on file   Number of children: Not on file   Years of education: Not on file   Highest education level: Not on file  Occupational History   Not on file  Tobacco Use   Smoking status: Every Day    Current packs/day: 1.00    Types: Cigarettes   Smokeless tobacco: Never  Vaping Use   Vaping status: Never Used  Substance and Sexual Activity   Alcohol use: Not Currently   Drug use: Never   Sexual activity: Not on file  Other Topics Concern   Not on file  Social History Narrative   Not on file   Social Drivers of Health   Financial Resource Strain: Not on file  Food Insecurity: Not on file  Transportation Needs: Not on file  Physical Activity: Not on file  Stress: Not on file  Social Connections: Unknown (03/04/2022)   Received from [facility]   Social Network    Social Network: Not on file    History reviewed. No pertinent family history.  Outpatient Encounter Medications as of 07/17/2024  Medication Sig   Accu-Chek FastClix Lancets MISC USE TO CHECK BLOOD SUGAR FOUR  TIMES DAILY BEFORE MEALS AND AT BEDTIME   Continuous Glucose Receiver (DEXCOM G7 RECEIVER) DEVI USE AS DIRECTED TO test blood sugar   FLUoxetine  (PROZAC ) 40 MG capsule TAKE ONE CAPSULE BY MOUTH EVERY DAY   glucose blood (ACCU-CHEK GUIDE TEST) test strip Use as instructed to monitor glucose twice daily   Insulin  Syringe 27G X 1/2 1 ML MISC Inject 10 Units into the skin 3 (three) times daily before meals.   lisinopril (ZESTRIL) 10 MG tablet Take 10 mg by mouth daily.   SURE COMFORT INS SYR 1CC/28G 28G X 1/2 1 ML MISC USE WITH INSULIN  THREE TIMES DAILY BEFORE MEALS   [DISCONTINUED] Continuous Glucose Sensor (DEXCOM G7 SENSOR) MISC Inject 1 Application into the skin as directed. Change sensor every 10 days as directed.   [DISCONTINUED] Dulaglutide  (TRULICITY ) 4.5 MG/0.5ML SOAJ Inject 4.5 mg as directed once a week.   [DISCONTINUED] glipiZIDE  (GLUCOTROL  XL) 5 MG 24 hr tablet Take 1 tablet (5 mg total) by mouth daily with breakfast.   [DISCONTINUED] insulin  glargine (LANTUS  SOLOSTAR) 100 UNIT/ML  Solostar Pen Inject 20 Units into the skin at bedtime.   [DISCONTINUED] Insulin  Pen Needle (PEN NEEDLES) 31G X 8 MM MISC Use to inject insulin  once daily   [DISCONTINUED] metFORMIN  (GLUCOPHAGE -XR) 500 MG 24 hr tablet Take 2 tablets (1,000 mg total) by mouth daily with breakfast.   albuterol  (VENTOLIN  HFA) 108 (90 Base) MCG/ACT inhaler INHALE TWO PUFFS BY MOUTH EVERY 6 HOURS AS NEEDED FOR wheezing, SHORTNESS OF BREATH (Patient not taking: Reported on 07/17/2024)   Continuous Glucose Sensor (DEXCOM G7 SENSOR) MISC Inject 1 Application into the skin as directed. Change sensor every 10 days as directed.   Dulaglutide  (TRULICITY ) 4.5 MG/0.5ML SOAJ Inject 4.5 mg as directed once a week.   FLOVENT  HFA 110 MCG/ACT inhaler INHALE TWO PUFFS BY MOUTH TWICE DAILY (Patient not taking: Reported on 07/17/2024)   glipiZIDE  (GLUCOTROL  XL) 5 MG 24 hr tablet Take 1 tablet (5 mg total) by mouth daily with breakfast.   hydrOXYzine  (ATARAX) 25 MG tablet Take 25 mg by mouth 3 (three) times daily as needed. (Patient not taking: Reported on 07/17/2024)   insulin  glargine (LANTUS  SOLOSTAR) 100 UNIT/ML Solostar Pen Inject 30 Units into the skin at bedtime.   Insulin  Pen Needle (PEN NEEDLES) 31G X 8 MM MISC Use to inject insulin  once daily   lithium carbonate (ESKALITH) 450 MG CR tablet Take 900 mg by mouth daily. (Patient not taking: Reported on 07/17/2024)   lurasidone (LATUDA) 20 MG TABS tablet Take 20 mg by mouth daily. (Patient not taking: Reported on 07/17/2024)   metFORMIN  (GLUCOPHAGE -XR) 500 MG 24 hr tablet Take 2 tablets (1,000 mg total) by mouth daily with breakfast.   mupirocin  ointment (BACTROBAN ) 2 % Apply 1 Application topically 2 (two) times daily. (Patient not taking: Reported on 07/17/2024)   neomycin -polymyxin-hydrocortisone (CORTISPORIN) OTIC solution Place 3 drops into the left ear 4 (four) times daily. X 5-7 days (Patient not taking: Reported on 07/17/2024)   No facility-administered encounter medications on file as of 07/17/2024.    ALLERGIES: No Known Allergies  VACCINATION STATUS: Immunization History  Administered Date(s) Administered   Influenza,inj,Quad PF,6+ Mos 09/12/2019, 07/30/2020, 10/21/2021, 08/10/2022   PFIZER(Purple Top)SARS-COV-2 Vaccination 01/31/2020, 02/25/2020, 11/25/2020   Pfizer(Comirnaty)Fall Seasonal Vaccine 12 years and older 08/26/2022   Tdap 02/27/2022    Diabetes He presents for his follow-up diabetic visit. He has type 2 diabetes mellitus. Onset time: Diagnosed at approx age of 23. His disease course has been improving. There are no hypoglycemic associated symptoms. Associated symptoms include blurred vision and fatigue. Pertinent negatives for diabetes include no polydipsia, no polyuria and no weight loss. There are no hypoglycemic complications. Symptoms are improving. Diabetic complications include retinopathy. Risk factors for coronary artery disease include diabetes mellitus,  family history, obesity, male sex, tobacco exposure and stress. Current diabetic treatment includes oral agent (dual therapy) and insulin  injections (and Trulicity ). He is compliant with treatment most of the time. His weight is fluctuating minimally. He is following a generally healthy diet. When asked about meal planning, he reported none. He has not had a previous visit with a dietitian. He participates in exercise intermittently. His home blood glucose trend is decreasing steadily. His overall blood glucose range is 130-140 mg/dl. (He presents today with his CGM showing mostly at target glycemic profile overall.  His POCT A1c today is 6.9%, improving from last visit of 7.1%.  Analysis of his CGM shows TIR 79%, TAR 19%, TBR 3% with a GMI of 6.6%.  He denies any significant hypoglycemia.) An ACE  inhibitor/angiotensin II receptor blocker is not being taken. He does not see a podiatrist.Eye exam is not current.     Review of systems  Constitutional: + increasing body weight, current Body mass index is 37.37 kg/m., + fatigue, no subjective hyperthermia, no subjective hypothermia Eyes: + blurry vision (+ retinopathy), no xerophthalmia ENT: no sore throat, no nodules palpated in throat, no dysphagia/odynophagia, no hoarseness Cardiovascular: no chest pain, no shortness of breath, no palpitations, no leg swelling Respiratory: no cough, no shortness of breath Gastrointestinal: no nausea/vomiting/diarrhea Musculoskeletal: no muscle/joint aches Skin: no rashes, no hyperemia Neurological: no tremors, no numbness, no tingling, no dizziness Psychiatric: no depression, no anxiety  Objective:     BP 108/70 (BP Location: Left Arm, Patient Position: Sitting, Cuff Size: Large)   Pulse 70   Ht 5' 8 (1.727 m)   Wt 245 lb 12.8 oz (111.5 kg)   BMI 37.37 kg/m   Wt Readings from Last 3 Encounters:  07/17/24 245 lb 12.8 oz (111.5 kg)  03/16/24 251 lb 3.2 oz (113.9 kg)  10/06/23 237 lb 9.6 oz (107.8 kg)      BP Readings from Last 3 Encounters:  07/17/24 108/70  03/16/24 110/80  12/12/23 108/75      Physical Exam- Limited  Constitutional:  Body mass index is 37.37 kg/m. , not in acute distress, normal state of mind Eyes:  EOMI, no exophthalmos Musculoskeletal: no gross deformities, strength intact in all four extremities, no gross restriction of joint movements Skin:  no rashes, no hyperemia Neurological: no tremor with outstretched hands   Diabetic Foot Exam - Simple   Simple Foot Form Diabetic Foot exam was performed with the following findings: Yes 07/17/2024  3:41 PM  Visual Inspection No deformities, no ulcerations, no other skin breakdown bilaterally: Yes Sensation Testing Intact to touch and monofilament testing bilaterally: Yes Pulse Check Posterior Tibialis and Dorsalis pulse intact bilaterally: Yes Comments Calluses bilaterally    CMP ( most recent) CMP     Component Value Date/Time   NA 141 03/12/2024 1000   K 4.5 03/12/2024 1000   CL 105 03/12/2024 1000   CO2 23 03/12/2024 1000   GLUCOSE 144 (H) 03/12/2024 1000   GLUCOSE 514 (HH) 09/07/2019 2035   BUN 10 03/12/2024 1000   CREATININE 0.83 03/12/2024 1000   CALCIUM 8.6 (L) 03/12/2024 1000   PROT 6.4 03/12/2024 1000   ALBUMIN 4.1 03/12/2024 1000   AST 21 03/12/2024 1000   ALT 24 03/12/2024 1000   ALKPHOS 92 03/12/2024 1000   BILITOT 0.6 03/12/2024 1000   GFRNONAA 103 09/17/2020 1449   GFRAA 119 09/17/2020 1449     Diabetic Labs (most recent): Lab Results  Component Value Date   HGBA1C 6.9 (A) 07/17/2024   HGBA1C 7.1 (A) 03/16/2024   HGBA1C 6.7 (A) 10/06/2023   MICROALBUR 80mg /L 07/17/2024     Lipid Panel ( most recent) Lipid Panel     Component Value Date/Time   CHOL 153 03/12/2024 1000   TRIG 152 (H) 03/12/2024 1000   HDL 33 (L) 03/12/2024 1000   CHOLHDL 4.6 03/12/2024 1000   LDLCALC 93 03/12/2024 1000   LABVLDL 27 03/12/2024 1000      Lab Results  Component Value Date   TSH 1.730  03/12/2024   TSH 1.39 12/28/2022   TSH 3.010 10/21/2021   TSH 4.580 (H) 06/17/2021   TSH 6.680 (H) 09/17/2020   FREET4 1.07 03/12/2024   FREET4 1.22 10/21/2021   FREET4 1.10 09/17/2020  Assessment & Plan:   1) Type 2 diabetes mellitus with hyperglycemia, with long-term current use of insulin  (HCC)  He presents today with his CGM showing mostly at target glycemic profile overall.  His POCT A1c today is 6.9%, improving from last visit of 7.1%.  Analysis of his CGM shows TIR 79%, TAR 19%, TBR 3% with a GMI of 6.6%.  He denies any significant hypoglycemia.  - Kollyn Lingafelter has currently uncontrolled symptomatic type 2 DM since 43 years of age.   -Recent labs reviewed.  POCT UM today shows mild microalbuminuria.  CMP kidney function was normal.    - I had a long discussion with him about the progressive nature of diabetes and the pathology behind its complications. -his diabetes is complicated by retinopathy and he remains at a high risk for more acute and chronic complications which include CAD, CVA, CKD, retinopathy, and neuropathy. These are all discussed in detail with him.  The following Lifestyle Medicine recommendations according to American College of Lifestyle Medicine [facility]) were discussed and offered to patient and he agrees to start the journey:  A. Whole Foods, Plant-based plate comprising of fruits and vegetables, plant-based proteins, whole-grain carbohydrates was discussed in detail with the patient.   A list for source of those nutrients were also provided to the patient.  Patient will use only water or unsweetened tea for hydration. B.  The need to stay away from risky substances including alcohol, smoking; obtaining 7 to 9 hours of restorative sleep, at least 150 minutes of moderate intensity exercise weekly, the importance of healthy social connections,  and stress reduction techniques were discussed. C.  A full color page of  Calorie density of various food  groups per pound showing examples of each food groups was provided to the patient.  - Nutritional counseling repeated at each appointment due to patients tendency to fall back in to old habits.  - The patient admits there is a room for improvement in their diet and drink choices. -  Suggestion is made for the patient to avoid simple carbohydrates from their diet including Cakes, Sweet Desserts / Pastries, Ice Cream, Soda (diet and regular), Sweet Tea, Candies, Chips, Cookies, Sweet Pastries, Store Bought Juices, Alcohol in Excess of 1-2 drinks a day, Artificial Sweeteners, Coffee Creamer, and Sugar-free Products. This will help patient to have stable blood glucose profile and potentially avoid unintended weight gain.   - I encouraged the patient to switch to unprocessed or minimally processed complex starch and increased protein intake (animal or plant source), fruits, and vegetables.   - Patient is advised to stick to a routine mealtimes to eat 3 meals a day and avoid unnecessary snacks (to snack only to correct hypoglycemia).  - I have approached him with the following individualized plan to manage his diabetes and patient agrees:   -Given his stable, at target glycemic profile, no changes will be made to his medications today.  He is advised to continue Lantus  30 units SQ nightly, Trulicity  4.5 mg SQ weekly, Metformin  1000 mg ER daily with breakfast, and Glipizide  5 mg XL daily with breakfast.  -he is encouraged to continue monitoring glucose at least twice daily (using his CGM), before breakfast and before bed, and to call the clinic if he has readings less than 70 or above 300 for 3 tests in a row.    - he is warned not to take insulin  without proper monitoring per orders. - Adjustment parameters are given to him for hypo and  hyperglycemia in writing.  - Specific targets for  A1c; LDL, HDL, and Triglycerides were discussed with the patient.  2) Blood Pressure /Hypertension:  his blood  pressure is controlled to target without the use of antihypertensive medications.   3) Lipids/Hyperlipidemia:    Review of his recent lipid panel from 03/12/24 showed controlled LDL at 93 and slightly elevated triglycerides of 152, he is not currently on any lipid lowering medications.    4)  Weight/Diet:  his Body mass index is 37.37 kg/m.  -  clearly complicating his diabetes care.   he is a candidate for weight loss. I discussed with him the fact that loss of 5 - 10% of his  current body weight will have the most impact on his diabetes management.  Exercise, and detailed carbohydrates information provided  -  detailed on discharge instructions.  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  5) Chronic Care/Health Maintenance: -he is not on ACEI/ARB or Statin medications and is encouraged to initiate and continue to follow up with Ophthalmology, Dentist, Podiatrist at least yearly or according to recommendations, and advised to [facility]. I have recommended yearly flu vaccine and pneumonia vaccine at least every 5 years; moderate intensity exercise for up to 150 minutes weekly; and sleep for at least 7 hours a day.  - he is advised to maintain close follow up with Gerome Tillman CROME, FNP for primary care needs, as well as his other providers for optimal and coordinated care.     I spent  53  minutes in the care of the patient today including review of labs from CMP, Lipids, Thyroid Function, Hematology (current and previous including abstractions from other facilities); face-to-face time discussing  his blood glucose readings/logs, discussing hypoglycemia and hyperglycemia episodes and symptoms, medications doses, his options of short and long term treatment based on the latest standards of care / guidelines;  discussion about incorporating lifestyle medicine;  and documenting the encounter. Risk reduction counseling performed per USPSTF guidelines to reduce obesity and cardiovascular risk  factors.     Please refer to Patient Instructions for Blood Glucose Monitoring and Insulin /Medications Dosing Guide  in media tab for additional information. Please  also refer to  Patient Self Inventory in the Media  tab for reviewed elements of pertinent patient history.  Elsie Sequin participated in the discussions, expressed understanding, and voiced agreement with the above plans.  All questions were answered to his satisfaction. he is encouraged to contact clinic should he have any questions or concerns prior to his return visit.     Follow up plan: - Return in about 4 months (around 11/16/2024) for Diabetes F/U with A1c in office, No previsit labs, Bring meter and logs.   Benton Rio, [facility] [address] [place], [place] [address] Phone: [phone] Fax: [phone]  07/17/2024, 3:43 PM

## 2024-08-15 ENCOUNTER — Telehealth: Payer: Self-pay | Admitting: Pharmacy Technician

## 2024-08-15 NOTE — Telephone Encounter (Signed)
 Pharmacy Patient Advocate Encounter   Received notification from CoverMyMeds that prior authorization for FreeStyle Libre 3 Plus Sensor is required/requested.   Insurance verification completed.   The patient is insured through [facility] MEDICA[place].   Action: Medication has been discontinued. Archived Key: [id number]

## 2024-08-27 ENCOUNTER — Other Ambulatory Visit (HOSPITAL_COMMUNITY): Payer: Self-pay

## 2024-08-27 ENCOUNTER — Telehealth: Payer: Self-pay

## 2024-08-27 NOTE — Telephone Encounter (Signed)
 Pharmacy Patient Advocate Encounter   Received notification from CoverMyMeds that prior authorization for Dexcom g7 receiver is required/requested.   Insurance verification completed.   The patient is insured through [facility].   Per test claim: PA required; PA submitted to above mentioned insurance via Latent Key/confirmation #/EOC [id number] Status is pending

## 2024-08-29 NOTE — Telephone Encounter (Signed)
 Patient was called and a message was left sharing that he had been approved.

## 2024-08-29 NOTE — Telephone Encounter (Signed)
 Pharmacy Patient Advocate Encounter  Received notification from [facility] that Prior Authorization for [facility] g7 receiver  has been APPROVED from 08-28-2024 to 08-28-2025   PA #/Case ID/Reference #: [id number]

## 2024-10-01 ENCOUNTER — Telehealth: Payer: Self-pay

## 2024-10-01 ENCOUNTER — Other Ambulatory Visit (HOSPITAL_COMMUNITY): Payer: Self-pay

## 2024-10-01 NOTE — Telephone Encounter (Signed)
 Pharmacy Patient Advocate Encounter   Received notification from CoverMyMeds that prior authorization for Dexcom G7 sensor is required/requested.   Insurance verification completed.   The patient is insured through [facility].   Per test claim: PA required; PA submitted to above mentioned insurance via Latent Key/confirmation #/EOC [id number] Status is pending

## 2024-10-08 NOTE — Telephone Encounter (Signed)
 Pharmacy Patient Advocate Encounter  Received notification from [facility] that Prior Authorization for Dexcom G7 sensor has been APPROVED from 10/01/2024 to 10/01/2025

## 2024-10-09 NOTE — Telephone Encounter (Signed)
 Patient was called and a message was left on his voicemail letting him know that his Dexcom Sensors had been approved.

## 2024-10-26 ENCOUNTER — Other Ambulatory Visit: Payer: Self-pay | Admitting: Nurse Practitioner

## 2024-11-20 ENCOUNTER — Encounter: Payer: Self-pay | Admitting: Nurse Practitioner

## 2024-11-20 ENCOUNTER — Ambulatory Visit: Admitting: Nurse Practitioner

## 2024-11-20 VITALS — BP 112/70 | HR 68 | Ht 68.0 in | Wt 250.4 lb

## 2024-11-20 DIAGNOSIS — E559 Vitamin D deficiency, unspecified: Secondary | ICD-10-CM

## 2024-11-20 DIAGNOSIS — E1165 Type 2 diabetes mellitus with hyperglycemia: Secondary | ICD-10-CM

## 2024-11-20 DIAGNOSIS — Z7984 Long term (current) use of oral hypoglycemic drugs: Secondary | ICD-10-CM

## 2024-11-20 DIAGNOSIS — Z7985 Long-term (current) use of injectable non-insulin antidiabetic drugs: Secondary | ICD-10-CM | POA: Diagnosis not present

## 2024-11-20 DIAGNOSIS — Z794 Long term (current) use of insulin: Secondary | ICD-10-CM | POA: Diagnosis not present

## 2024-11-20 LAB — POCT GLYCOSYLATED HEMOGLOBIN (HGB A1C): Hemoglobin A1C: 8.7 % — AB (ref 4.0–5.6)

## 2024-11-20 MED ORDER — TIRZEPATIDE 7.5 MG/0.5ML ~~LOC~~ SOAJ: 7.5000 mg | SUBCUTANEOUS | 1 refills | Status: DC

## 2024-11-20 NOTE — Progress Notes (Signed)
 "                                                                        Endocrinology Follow Up Note       11/20/2024, 3:24 PM   Subjective:    Patient ID: Isaac Weiss, male    DOB: 05-29-1981.  Isaac Weiss is being seen in follow up after being seen in consultation for management of currently uncontrolled symptomatic diabetes requested by  Gerome Tillman CROME, FNP.   Past Medical History:  Diagnosis Date   Anxiety    Phreesia 07/27/2020   Arthritis    Phreesia 07/27/2020   Asthma    COPD (chronic obstructive pulmonary disease) (HCC)    Depression    Phreesia 07/27/2020   Diabetes mellitus without complication (HCC)    Hyperlipidemia    Phreesia 07/27/2020   Neuromuscular disorder (HCC)    Phreesia 07/27/2020   Sleep apnea    Phreesia 07/27/2020    History reviewed. No pertinent surgical history.  Social History   Socioeconomic History   Marital status: Married    Spouse name: Not on file   Number of children: Not on file   Years of education: Not on file   Highest education level: Not on file  Occupational History   Not on file  Tobacco Use   Smoking status: Every Day    Current packs/day: 1.00    Types: Cigarettes   Smokeless tobacco: Never  Vaping Use   Vaping status: Never Used  Substance and Sexual Activity   Alcohol use: Not Currently   Drug use: Never   Sexual activity: Not on file  Other Topics Concern   Not on file  Social History Narrative   Not on file   Social Drivers of Health   Tobacco Use: High Risk (11/20/2024)   Patient History    Smoking Tobacco Use: Every Day    Smokeless Tobacco Use: Never    Passive Exposure: Not on file  Financial Resource Strain: Not on file  Food Insecurity: Not on file  Transportation Needs: Not on file  Physical Activity: Not on file  Stress: Not on file  Social Connections: Unknown (03/04/2022)   Received from [facility]   Social Network    Social Network: Not on file  Depression (PHQ2-9): Low  Risk (04/22/2022)   Depression (PHQ2-9)    PHQ-2 Score: 0  Alcohol Screen: Not on file  Housing: Not on file  Utilities: Not on file  Health Literacy: Not on file    History reviewed. No pertinent family history.  Outpatient Encounter Medications as of 11/20/2024  Medication Sig   Accu-Chek FastClix Lancets MISC USE TO CHECK BLOOD SUGAR FOUR TIMES DAILY BEFORE MEALS AND AT BEDTIME   Continuous Glucose Receiver (DEXCOM G7 RECEIVER) DEVI USE AS DIRECTED TO test blood sugar   Continuous Glucose Sensor (DEXCOM G7 SENSOR) MISC Inject 1 Application into the skin as directed. Change sensor every 10 days as directed.   FLUoxetine  (PROZAC ) 40 MG capsule TAKE ONE CAPSULE BY MOUTH EVERY DAY   glipiZIDE  (GLUCOTROL  XL) 5 MG 24 hr tablet Take 1 tablet (5 mg total) by mouth daily with breakfast.   glucose blood (ACCU-CHEK GUIDE TEST) test strip CHECK  BLOOD SUGAR TWICE DAILY   insulin  glargine (LANTUS  SOLOSTAR) 100 UNIT/ML Solostar Pen Inject 30 Units into the skin at bedtime. (Patient taking differently: Inject 35 Units into the skin at bedtime.)   Insulin  Pen Needle (PEN NEEDLES) 31G X 8 MM MISC Use to inject insulin  once daily   Insulin  Syringe 27G X 1/2 1 ML MISC Inject 10 Units into the skin 3 (three) times daily before meals.   lisinopril (ZESTRIL) 10 MG tablet Take 10 mg by mouth daily.   metFORMIN  (GLUCOPHAGE -XR) 500 MG 24 hr tablet Take 2 tablets (1,000 mg total) by mouth daily with breakfast.   SURE COMFORT INS SYR 1CC/28G 28G X 1/2 1 ML MISC USE WITH INSULIN  THREE TIMES DAILY BEFORE MEALS   tirzepatide  (MOUNJARO ) 7.5 MG/0.5ML Pen Inject 7.5 mg into the skin once a week.   [DISCONTINUED] Dulaglutide  (TRULICITY ) 4.5 MG/0.5ML SOAJ Inject 4.5 mg as directed once a week.   [DISCONTINUED] albuterol  (VENTOLIN  HFA) 108 (90 Base) MCG/ACT inhaler INHALE TWO PUFFS BY MOUTH EVERY 6 HOURS AS NEEDED FOR wheezing, SHORTNESS OF BREATH (Patient not taking: Reported on 11/20/2024)   [DISCONTINUED] FLOVENT  HFA  110 MCG/ACT inhaler INHALE TWO PUFFS BY MOUTH TWICE DAILY (Patient not taking: Reported on 11/20/2024)   [DISCONTINUED] hydrOXYzine (ATARAX) 25 MG tablet Take 25 mg by mouth 3 (three) times daily as needed. (Patient not taking: Reported on 11/20/2024)   [DISCONTINUED] lithium carbonate (ESKALITH) 450 MG CR tablet Take 900 mg by mouth daily. (Patient not taking: Reported on 11/20/2024)   [DISCONTINUED] lurasidone (LATUDA) 20 MG TABS tablet Take 20 mg by mouth daily. (Patient not taking: Reported on 11/20/2024)   [DISCONTINUED] mupirocin  ointment (BACTROBAN ) 2 % Apply 1 Application topically 2 (two) times daily. (Patient not taking: Reported on 11/20/2024)   [DISCONTINUED] neomycin -polymyxin-hydrocortisone (CORTISPORIN) OTIC solution Place 3 drops into the left ear 4 (four) times daily. X 5-7 days (Patient not taking: Reported on 11/20/2024)   No facility-administered encounter medications on file as of 11/20/2024.    ALLERGIES: No Known Allergies  VACCINATION STATUS: Immunization History  Administered Date(s) Administered   Influenza,inj,Quad PF,6+ Mos 09/12/2019, 07/30/2020, 10/21/2021, 08/10/2022   PFIZER(Purple Top)SARS-COV-2 Vaccination 01/31/2020, 02/25/2020, 11/25/2020   Pfizer(Comirnaty)Fall Seasonal Vaccine 12 years and older 08/26/2022   Tdap 02/27/2022    Diabetes He presents for his follow-up diabetic visit. He has type 2 diabetes mellitus. Onset time: Diagnosed at approx age of 86. His disease course has been worsening. There are no hypoglycemic associated symptoms. Associated symptoms include blurred vision and fatigue. Pertinent negatives for diabetes include no polydipsia, no polyuria and no weight loss. There are no hypoglycemic complications. Symptoms are improving. Diabetic complications include retinopathy. Risk factors for coronary artery disease include diabetes mellitus, family history, obesity, male sex, tobacco exposure and stress. Current diabetic treatment includes oral agent  (dual therapy) and insulin  injections (and Trulicity ). He is compliant with treatment most of the time. His weight is fluctuating minimally. He is following a generally healthy diet. When asked about meal planning, he reported none. He has not had a previous visit with a dietitian. He participates in exercise intermittently. His home blood glucose trend is increasing steadily. His overall blood glucose range is 180-200 mg/dl. (He presents today with his CGM showing above target glycemic profile overall.  His POCT A1c today is 8.7%, increasing from last visit of 6.9%.  Analysis of his CGM shows TIR 46%, TAR 54%, TBR 0% with a GMI of 7.9%.  He denies any significant hypoglycemia.  He notes  he doesn't think the Trulicity  is helping him much anymore.) An ACE inhibitor/angiotensin II receptor blocker is not being taken. He does not see a podiatrist.Eye exam is not current.     Review of systems  Constitutional: + increasing body weight, current Body mass index is 38.07 kg/m., + fatigue, no subjective hyperthermia, no subjective hypothermia Eyes: + blurry vision (+ retinopathy), no xerophthalmia ENT: no sore throat, no nodules palpated in throat, no dysphagia/odynophagia, no hoarseness Cardiovascular: no chest pain, no shortness of breath, no palpitations, no leg swelling Respiratory: no cough, no shortness of breath Gastrointestinal: no nausea/vomiting/diarrhea Musculoskeletal: no muscle/joint aches Skin: no rashes, no hyperemia Neurological: no tremors, no numbness, no tingling, no dizziness Psychiatric: no depression, no anxiety  Objective:     BP 112/70 (BP Location: Left Arm, Patient Position: Sitting, Cuff Size: Large)   Pulse 68   Ht 5' 8 (1.727 m)   Wt 250 lb 6.4 oz (113.6 kg)   BMI 38.07 kg/m   Wt Readings from Last 3 Encounters:  11/20/24 250 lb 6.4 oz (113.6 kg)  07/17/24 245 lb 12.8 oz (111.5 kg)  03/16/24 251 lb 3.2 oz (113.9 kg)     BP Readings from Last 3 Encounters:   11/20/24 112/70  07/17/24 108/70  03/16/24 110/80      Physical Exam- Limited  Constitutional:  Body mass index is 38.07 kg/m. , not in acute distress, normal state of mind Eyes:  EOMI, no exophthalmos Musculoskeletal: no gross deformities, strength intact in all four extremities, no gross restriction of joint movements Skin:  no rashes, no hyperemia Neurological: no tremor with outstretched hands   Diabetic Foot Exam - Simple   No data filed    CMP ( most recent) CMP     Component Value Date/Time   NA 141 03/12/2024 1000   K 4.5 03/12/2024 1000   CL 105 03/12/2024 1000   CO2 23 03/12/2024 1000   GLUCOSE 144 (H) 03/12/2024 1000   GLUCOSE 514 (HH) 09/07/2019 2035   BUN 10 03/12/2024 1000   CREATININE 0.83 03/12/2024 1000   CALCIUM 8.6 (L) 03/12/2024 1000   PROT 6.4 03/12/2024 1000   ALBUMIN 4.1 03/12/2024 1000   AST 21 03/12/2024 1000   ALT 24 03/12/2024 1000   ALKPHOS 92 03/12/2024 1000   BILITOT 0.6 03/12/2024 1000   GFRNONAA 103 09/17/2020 1449   GFRAA 119 09/17/2020 1449     Diabetic Labs (most recent): Lab Results  Component Value Date   HGBA1C 8.7 (A) 11/20/2024   HGBA1C 6.9 (A) 07/17/2024   HGBA1C 7.1 (A) 03/16/2024   MICROALBUR 80mg /L 07/17/2024     Lipid Panel ( most recent) Lipid Panel     Component Value Date/Time   CHOL 153 03/12/2024 1000   TRIG 152 (H) 03/12/2024 1000   HDL 33 (L) 03/12/2024 1000   CHOLHDL 4.6 03/12/2024 1000   LDLCALC 93 03/12/2024 1000   LABVLDL 27 03/12/2024 1000      Lab Results  Component Value Date   TSH 1.730 03/12/2024   TSH 1.39 12/28/2022   TSH 3.010 10/21/2021   TSH 4.580 (H) 06/17/2021   TSH 6.680 (H) 09/17/2020   FREET4 1.07 03/12/2024   FREET4 1.22 10/21/2021   FREET4 1.10 09/17/2020           Assessment & Plan:   1) Type 2 diabetes mellitus with hyperglycemia, with long-term current use of insulin  (HCC)  He presents today with his CGM showing above target glycemic profile overall.  His  POCT A1c today is 8.7%, increasing from last visit of 6.9%.  Analysis of his CGM shows TIR 46%, TAR 54%, TBR 0% with a GMI of 7.9%.  He denies any significant hypoglycemia.  He notes he doesn't think the Trulicity  is helping him much anymore.  - Isaac Weiss has currently uncontrolled symptomatic type 2 DM since 44 years of age.   -Recent labs reviewed.    - I had a long discussion with him about the progressive nature of diabetes and the pathology behind its complications. -his diabetes is complicated by retinopathy and he remains at a high risk for more acute and chronic complications which include CAD, CVA, CKD, retinopathy, and neuropathy. These are all discussed in detail with him.  The following Lifestyle Medicine recommendations according to American College of Lifestyle Medicine [facility]) were discussed and offered to patient and he agrees to start the journey:  A. Whole Foods, Plant-based plate comprising of fruits and vegetables, plant-based proteins, whole-grain carbohydrates was discussed in detail with the patient.   A list for source of those nutrients were also provided to the patient.  Patient will use only water or unsweetened tea for hydration. B.  The need to stay away from risky substances including alcohol, smoking; obtaining 7 to 9 hours of restorative sleep, at least 150 minutes of moderate intensity exercise weekly, the importance of healthy social connections,  and stress reduction techniques were discussed. C.  A full color page of  Calorie density of various food groups per pound showing examples of each food groups was provided to the patient.  - Nutritional counseling repeated/built upon at each appointment.  - The patient admits there is a room for improvement in their diet and drink choices. -  Suggestion is made for the patient to avoid simple carbohydrates from their diet including Cakes, Sweet Desserts / Pastries, Ice Cream, Soda (diet and regular), Sweet Tea,  Candies, Chips, Cookies, Sweet Pastries, Store Bought Juices, Alcohol in Excess of 1-2 drinks a day, Artificial Sweeteners, Coffee Creamer, and Sugar-free Products. This will help patient to have stable blood glucose profile and potentially avoid unintended weight gain.   - I encouraged the patient to switch to unprocessed or minimally processed complex starch and increased protein intake (animal or plant source), fruits, and vegetables.   - Patient is advised to stick to a routine mealtimes to eat 3 meals a day and avoid unnecessary snacks (to snack only to correct hypoglycemia).  - I have approached him with the following individualized plan to manage his diabetes and patient agrees:   -He is advised to continue Lantus  35 units SQ nightly, Metformin  1000 mg ER daily with breakfast, and Glipizide  5 mg XL daily with breakfast.  Will try switching to GIP therapy to gain better control of diabetes and hopefully reduce insulin  burden in the future.  Will try Mounjaro  7.5 mg SQ weekly.  Does not need to start on lowest dose as he has been on highest dose of Trulicity  for quite some time and tolerating it well.  -he is encouraged to continue monitoring glucose at least twice daily (using his CGM), before breakfast and before bed, and to call the clinic if he has readings less than 70 or above 300 for 3 tests in a row.    - he is warned not to take insulin  without proper monitoring per orders. - Adjustment parameters are given to him for hypo and hyperglycemia in writing.  - Specific targets for  A1c;  LDL, HDL, and Triglycerides were discussed with the patient.  2) Blood Pressure /Hypertension:  his blood pressure is controlled to target without the use of antihypertensive medications.   3) Lipids/Hyperlipidemia:    Review of his recent lipid panel from 03/12/24 showed controlled LDL at 93 and slightly elevated triglycerides of 152, he is not currently on any lipid lowering medications.  Will recheck  lipid panel prior to next visit.  4)  Weight/Diet:  his Body mass index is 38.07 kg/m.  -  clearly complicating his diabetes care.   he is a candidate for weight loss. I discussed with him the fact that loss of 5 - 10% of his  current body weight will have the most impact on his diabetes management.  Exercise, and detailed carbohydrates information provided  -  detailed on discharge instructions.  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  5) Chronic Care/Health Maintenance: -he is not on ACEI/ARB or Statin medications and is encouraged to initiate and continue to follow up with Ophthalmology, Dentist, Podiatrist at least yearly or according to recommendations, and advised to [facility]. I have recommended yearly flu vaccine and pneumonia vaccine at least every 5 years; moderate intensity exercise for up to 150 minutes weekly; and sleep for at least 7 hours a day.  - he is advised to maintain close follow up with Gerome Tillman CROME, FNP for primary care needs, as well as his other providers for optimal and coordinated care.     I spent  25  minutes in the care of the patient today including review of labs from CMP, Lipids, Thyroid Function, Hematology (current and previous including abstractions from other facilities); face-to-face time discussing  his blood glucose readings/logs, discussing hypoglycemia and hyperglycemia episodes and symptoms, medications doses, his options of short and long term treatment based on the latest standards of care / guidelines;  discussion about incorporating lifestyle medicine;  and documenting the encounter. Risk reduction counseling performed per USPSTF guidelines to reduce obesity and cardiovascular risk factors.     Please refer to Patient Instructions for Blood Glucose Monitoring and Insulin /Medications Dosing Guide  in media tab for additional information. Please  also refer to  Patient Self Inventory in the Media  tab for reviewed elements of pertinent  patient history.  Isaac Weiss participated in the discussions, expressed understanding, and voiced agreement with the above plans.  All questions were answered to his satisfaction. he is encouraged to contact clinic should he have any questions or concerns prior to his return visit.     Follow up plan: - Return in about 3 months (around 02/18/2025) for Diabetes F/U with A1c in office, Previsit labs, Bring meter and logs.   Benton Rio, [facility] [address] [place], [place] [address] Phone: [phone] Fax: [phone]  11/20/2024, 3:24 PM    "

## 2024-11-21 ENCOUNTER — Other Ambulatory Visit: Payer: Self-pay | Admitting: *Deleted

## 2024-11-21 DIAGNOSIS — Z7984 Long term (current) use of oral hypoglycemic drugs: Secondary | ICD-10-CM

## 2024-11-21 DIAGNOSIS — Z794 Long term (current) use of insulin: Secondary | ICD-10-CM

## 2024-11-21 MED ORDER — TIRZEPATIDE 7.5 MG/0.5ML ~~LOC~~ SOAJ: 7.5000 mg | SUBCUTANEOUS | 1 refills | Status: AC

## 2024-12-07 ENCOUNTER — Other Ambulatory Visit (HOSPITAL_COMMUNITY): Payer: Self-pay

## 2024-12-07 ENCOUNTER — Telehealth: Payer: Self-pay

## 2024-12-07 ENCOUNTER — Telehealth: Payer: Self-pay | Admitting: Nurse Practitioner

## 2024-12-07 NOTE — Telephone Encounter (Signed)
 Pharmacy Patient Advocate Encounter   Received notification from Pt Calls Messages that prior authorization for Mounjaro  7.5mg /0.66ml is required/requested.   Insurance verification completed.   The patient is insured through [facility].   Per test claim: PA required; PA started via CoverMyMeds. KEY [id number] . Please see clinical question(s) below that I am not finding the answer to in their chart and advise.     I am only finding documentation that he has tried one of the preferred alternatives (Trulicity ). Insurance is requiring that he tries 2 before they will cover Mounjaro .

## 2025-02-27 ENCOUNTER — Ambulatory Visit: Admitting: Nurse Practitioner
